# Patient Record
Sex: Female | Born: 2000 | Race: White | Hispanic: No | Marital: Single | Smoking: Never smoker
Health system: Southern US, Community
[De-identification: ages and names within clinical notes are randomized; demographics above are authoritative.]

## PROBLEM LIST (undated history)

## (undated) DIAGNOSIS — R519 Headache, unspecified: Secondary | ICD-10-CM

## (undated) DIAGNOSIS — R51 Headache: Secondary | ICD-10-CM

## (undated) DIAGNOSIS — T7840XA Allergy, unspecified, initial encounter: Secondary | ICD-10-CM

## (undated) HISTORY — DX: Allergy, unspecified, initial encounter: T78.40XA

## (undated) HISTORY — DX: Headache: R51

## (undated) HISTORY — DX: Headache, unspecified: R51.9

---
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
## 2020-01-19 DIAGNOSIS — F411 Generalized anxiety disorder: Secondary | ICD-10-CM | POA: Diagnosis not present

## 2020-01-20 DIAGNOSIS — U071 COVID-19: Secondary | ICD-10-CM | POA: Diagnosis not present

## 2020-01-20 DIAGNOSIS — R0981 Nasal congestion: Secondary | ICD-10-CM | POA: Diagnosis not present

## 2020-01-20 DIAGNOSIS — J029 Acute pharyngitis, unspecified: Secondary | ICD-10-CM | POA: Diagnosis not present

## 2020-01-21 DIAGNOSIS — J069 Acute upper respiratory infection, unspecified: Secondary | ICD-10-CM | POA: Diagnosis not present

## 2020-02-16 DIAGNOSIS — M25571 Pain in right ankle and joints of right foot: Secondary | ICD-10-CM | POA: Diagnosis not present

## 2020-03-07 DIAGNOSIS — J069 Acute upper respiratory infection, unspecified: Secondary | ICD-10-CM | POA: Diagnosis not present

## 2020-05-14 DIAGNOSIS — B349 Viral infection, unspecified: Secondary | ICD-10-CM | POA: Diagnosis not present

## 2020-05-14 DIAGNOSIS — Z20822 Contact with and (suspected) exposure to covid-19: Secondary | ICD-10-CM | POA: Diagnosis not present

## 2020-06-06 DIAGNOSIS — F411 Generalized anxiety disorder: Secondary | ICD-10-CM | POA: Diagnosis not present

## 2020-06-08 DIAGNOSIS — Z20822 Contact with and (suspected) exposure to covid-19: Secondary | ICD-10-CM | POA: Diagnosis not present

## 2020-07-21 DIAGNOSIS — Z0000 Encounter for general adult medical examination without abnormal findings: Secondary | ICD-10-CM | POA: Diagnosis not present

## 2020-07-28 DIAGNOSIS — Z6822 Body mass index (BMI) 22.0-22.9, adult: Secondary | ICD-10-CM | POA: Diagnosis not present

## 2020-07-28 DIAGNOSIS — Z01419 Encounter for gynecological examination (general) (routine) without abnormal findings: Secondary | ICD-10-CM | POA: Diagnosis not present

## 2020-08-01 DIAGNOSIS — J029 Acute pharyngitis, unspecified: Secondary | ICD-10-CM | POA: Diagnosis not present

## 2020-08-05 DIAGNOSIS — J358 Other chronic diseases of tonsils and adenoids: Secondary | ICD-10-CM | POA: Diagnosis not present

## 2020-08-11 DIAGNOSIS — Z20822 Contact with and (suspected) exposure to covid-19: Secondary | ICD-10-CM | POA: Diagnosis not present

## 2020-10-04 DIAGNOSIS — Z23 Encounter for immunization: Secondary | ICD-10-CM | POA: Diagnosis not present

## 2020-10-10 DIAGNOSIS — R509 Fever, unspecified: Secondary | ICD-10-CM | POA: Diagnosis not present

## 2020-10-10 DIAGNOSIS — R059 Cough, unspecified: Secondary | ICD-10-CM | POA: Diagnosis not present

## 2020-10-10 DIAGNOSIS — R0981 Nasal congestion: Secondary | ICD-10-CM | POA: Diagnosis not present

## 2020-10-20 DIAGNOSIS — F3341 Major depressive disorder, recurrent, in partial remission: Secondary | ICD-10-CM | POA: Diagnosis not present

## 2020-11-30 DIAGNOSIS — J020 Streptococcal pharyngitis: Secondary | ICD-10-CM | POA: Diagnosis not present

## 2020-11-30 DIAGNOSIS — R42 Dizziness and giddiness: Secondary | ICD-10-CM | POA: Diagnosis not present

## 2020-11-30 DIAGNOSIS — R0981 Nasal congestion: Secondary | ICD-10-CM | POA: Diagnosis not present

## 2020-12-13 DIAGNOSIS — F411 Generalized anxiety disorder: Secondary | ICD-10-CM | POA: Diagnosis not present

## 2020-12-20 DIAGNOSIS — F411 Generalized anxiety disorder: Secondary | ICD-10-CM | POA: Diagnosis not present

## 2020-12-27 DIAGNOSIS — F411 Generalized anxiety disorder: Secondary | ICD-10-CM | POA: Diagnosis not present

## 2021-01-03 DIAGNOSIS — F411 Generalized anxiety disorder: Secondary | ICD-10-CM | POA: Diagnosis not present

## 2021-01-10 DIAGNOSIS — J039 Acute tonsillitis, unspecified: Secondary | ICD-10-CM | POA: Diagnosis not present

## 2021-01-10 DIAGNOSIS — F411 Generalized anxiety disorder: Secondary | ICD-10-CM | POA: Diagnosis not present

## 2021-01-17 DIAGNOSIS — F411 Generalized anxiety disorder: Secondary | ICD-10-CM | POA: Diagnosis not present

## 2021-02-02 DIAGNOSIS — F3341 Major depressive disorder, recurrent, in partial remission: Secondary | ICD-10-CM | POA: Diagnosis not present

## 2021-02-02 DIAGNOSIS — F902 Attention-deficit hyperactivity disorder, combined type: Secondary | ICD-10-CM | POA: Diagnosis not present

## 2021-02-09 DIAGNOSIS — F411 Generalized anxiety disorder: Secondary | ICD-10-CM | POA: Diagnosis not present

## 2021-03-01 DIAGNOSIS — F411 Generalized anxiety disorder: Secondary | ICD-10-CM | POA: Diagnosis not present

## 2021-03-08 DIAGNOSIS — F411 Generalized anxiety disorder: Secondary | ICD-10-CM | POA: Diagnosis not present

## 2021-03-13 DIAGNOSIS — F411 Generalized anxiety disorder: Secondary | ICD-10-CM | POA: Diagnosis not present

## 2021-03-22 DIAGNOSIS — F411 Generalized anxiety disorder: Secondary | ICD-10-CM | POA: Diagnosis not present

## 2021-03-29 DIAGNOSIS — F411 Generalized anxiety disorder: Secondary | ICD-10-CM | POA: Diagnosis not present

## 2021-04-05 DIAGNOSIS — F411 Generalized anxiety disorder: Secondary | ICD-10-CM | POA: Diagnosis not present

## 2021-04-12 DIAGNOSIS — F411 Generalized anxiety disorder: Secondary | ICD-10-CM | POA: Diagnosis not present

## 2021-04-18 DIAGNOSIS — F411 Generalized anxiety disorder: Secondary | ICD-10-CM | POA: Diagnosis not present

## 2021-04-24 DIAGNOSIS — Z113 Encounter for screening for infections with a predominantly sexual mode of transmission: Secondary | ICD-10-CM | POA: Diagnosis not present

## 2021-04-26 DIAGNOSIS — F411 Generalized anxiety disorder: Secondary | ICD-10-CM | POA: Diagnosis not present

## 2021-05-04 DIAGNOSIS — F411 Generalized anxiety disorder: Secondary | ICD-10-CM | POA: Diagnosis not present

## 2021-05-10 DIAGNOSIS — F411 Generalized anxiety disorder: Secondary | ICD-10-CM | POA: Diagnosis not present

## 2021-05-11 DIAGNOSIS — Z309 Encounter for contraceptive management, unspecified: Secondary | ICD-10-CM | POA: Diagnosis not present

## 2021-05-12 DIAGNOSIS — F411 Generalized anxiety disorder: Secondary | ICD-10-CM | POA: Diagnosis not present

## 2021-05-18 DIAGNOSIS — F411 Generalized anxiety disorder: Secondary | ICD-10-CM | POA: Diagnosis not present

## 2021-05-22 DIAGNOSIS — N946 Dysmenorrhea, unspecified: Secondary | ICD-10-CM | POA: Diagnosis not present

## 2021-05-22 DIAGNOSIS — N926 Irregular menstruation, unspecified: Secondary | ICD-10-CM | POA: Diagnosis not present

## 2021-05-24 DIAGNOSIS — R5383 Other fatigue: Secondary | ICD-10-CM | POA: Diagnosis not present

## 2021-05-24 DIAGNOSIS — R112 Nausea with vomiting, unspecified: Secondary | ICD-10-CM | POA: Diagnosis not present

## 2021-05-24 DIAGNOSIS — Z3046 Encounter for surveillance of implantable subdermal contraceptive: Secondary | ICD-10-CM | POA: Diagnosis not present

## 2021-05-24 DIAGNOSIS — Z30011 Encounter for initial prescription of contraceptive pills: Secondary | ICD-10-CM | POA: Diagnosis not present

## 2021-05-25 ENCOUNTER — Emergency Department (HOSPITAL_COMMUNITY): Payer: BC Managed Care – PPO

## 2021-05-25 ENCOUNTER — Encounter (HOSPITAL_COMMUNITY): Payer: Self-pay

## 2021-05-25 ENCOUNTER — Other Ambulatory Visit: Payer: Self-pay

## 2021-05-25 ENCOUNTER — Inpatient Hospital Stay (HOSPITAL_COMMUNITY)
Admission: EM | Admit: 2021-05-25 | Discharge: 2021-05-31 | DRG: 700 | Disposition: A | Discharge status: Home / Self Care | Payer: BC Managed Care – PPO | Attending: Internal Medicine | Admitting: Internal Medicine

## 2021-05-25 DIAGNOSIS — Y829 Unspecified medical devices associated with adverse incidents: Secondary | ICD-10-CM | POA: Diagnosis present

## 2021-05-25 DIAGNOSIS — F419 Anxiety disorder, unspecified: Secondary | ICD-10-CM | POA: Diagnosis present

## 2021-05-25 DIAGNOSIS — Z880 Allergy status to penicillin: Secondary | ICD-10-CM

## 2021-05-25 DIAGNOSIS — Z20822 Contact with and (suspected) exposure to covid-19: Secondary | ICD-10-CM | POA: Diagnosis present

## 2021-05-25 DIAGNOSIS — F32A Depression, unspecified: Secondary | ICD-10-CM | POA: Diagnosis not present

## 2021-05-25 DIAGNOSIS — K819 Cholecystitis, unspecified: Secondary | ICD-10-CM | POA: Diagnosis present

## 2021-05-25 DIAGNOSIS — R109 Unspecified abdominal pain: Secondary | ICD-10-CM | POA: Diagnosis not present

## 2021-05-25 DIAGNOSIS — R748 Abnormal levels of other serum enzymes: Secondary | ICD-10-CM | POA: Diagnosis not present

## 2021-05-25 DIAGNOSIS — R1011 Right upper quadrant pain: Secondary | ICD-10-CM | POA: Diagnosis not present

## 2021-05-25 DIAGNOSIS — R112 Nausea with vomiting, unspecified: Secondary | ICD-10-CM | POA: Diagnosis not present

## 2021-05-25 DIAGNOSIS — R7401 Elevation of levels of liver transaminase levels: Secondary | ICD-10-CM | POA: Diagnosis not present

## 2021-05-25 DIAGNOSIS — K828 Other specified diseases of gallbladder: Secondary | ICD-10-CM | POA: Diagnosis not present

## 2021-05-25 DIAGNOSIS — Z79899 Other long term (current) drug therapy: Secondary | ICD-10-CM | POA: Diagnosis not present

## 2021-05-25 DIAGNOSIS — R932 Abnormal findings on diagnostic imaging of liver and biliary tract: Secondary | ICD-10-CM | POA: Diagnosis not present

## 2021-05-25 DIAGNOSIS — T8389XA Other specified complication of genitourinary prosthetic devices, implants and grafts, initial encounter: Principal | ICD-10-CM | POA: Diagnosis present

## 2021-05-25 DIAGNOSIS — K7589 Other specified inflammatory liver diseases: Secondary | ICD-10-CM | POA: Diagnosis present

## 2021-05-25 DIAGNOSIS — R945 Abnormal results of liver function studies: Secondary | ICD-10-CM | POA: Diagnosis not present

## 2021-05-25 DIAGNOSIS — R161 Splenomegaly, not elsewhere classified: Secondary | ICD-10-CM | POA: Diagnosis not present

## 2021-05-25 DIAGNOSIS — R160 Hepatomegaly, not elsewhere classified: Secondary | ICD-10-CM | POA: Diagnosis not present

## 2021-05-25 DIAGNOSIS — K810 Acute cholecystitis: Secondary | ICD-10-CM | POA: Diagnosis not present

## 2021-05-25 LAB — CBC WITH DIFFERENTIAL/PLATELET
Abs Immature Granulocytes: 0.1 10*3/uL — ABNORMAL HIGH (ref 0.00–0.07)
Basophils Absolute: 0 10*3/uL (ref 0.0–0.1)
Basophils Relative: 0 %
Eosinophils Absolute: 0.2 10*3/uL (ref 0.0–0.5)
Eosinophils Relative: 2 %
HCT: 39.9 % (ref 36.0–46.0)
Hemoglobin: 12.8 g/dL (ref 12.0–15.0)
Lymphocytes Relative: 60 %
Lymphs Abs: 4.6 10*3/uL — ABNORMAL HIGH (ref 0.7–4.0)
MCH: 29.8 pg (ref 26.0–34.0)
MCHC: 32.1 g/dL (ref 30.0–36.0)
MCV: 93 fL (ref 80.0–100.0)
Monocytes Absolute: 0.2 10*3/uL (ref 0.1–1.0)
Monocytes Relative: 3 %
Myelocytes: 1 %
Neutro Abs: 2.6 10*3/uL (ref 1.7–7.7)
Neutrophils Relative %: 34 %
Platelets: 137 10*3/uL — ABNORMAL LOW (ref 150–400)
RBC: 4.29 MIL/uL (ref 3.87–5.11)
RDW: 13.4 % (ref 11.5–15.5)
WBC: 7.7 10*3/uL (ref 4.0–10.5)
nRBC: 0 % (ref 0.0–0.2)
nRBC: 0 /100 WBC

## 2021-05-25 LAB — URINALYSIS, ROUTINE W REFLEX MICROSCOPIC
Glucose, UA: NEGATIVE mg/dL
Hgb urine dipstick: NEGATIVE
Ketones, ur: NEGATIVE mg/dL
Leukocytes,Ua: NEGATIVE
Nitrite: NEGATIVE
Protein, ur: NEGATIVE mg/dL
Specific Gravity, Urine: 1.015 (ref 1.005–1.030)
pH: 5 (ref 5.0–8.0)

## 2021-05-25 LAB — PROTIME-INR
INR: 1 (ref 0.8–1.2)
Prothrombin Time: 12.8 seconds (ref 11.4–15.2)

## 2021-05-25 LAB — CBC
HCT: 45.5 % (ref 36.0–46.0)
Hemoglobin: 14.7 g/dL (ref 12.0–15.0)
MCH: 29.6 pg (ref 26.0–34.0)
MCHC: 32.3 g/dL (ref 30.0–36.0)
MCV: 91.7 fL (ref 80.0–100.0)
Platelets: 142 10*3/uL — ABNORMAL LOW (ref 150–400)
RBC: 4.96 MIL/uL (ref 3.87–5.11)
RDW: 13.4 % (ref 11.5–15.5)
WBC: 9.1 10*3/uL (ref 4.0–10.5)
nRBC: 0 % (ref 0.0–0.2)

## 2021-05-25 LAB — COMPREHENSIVE METABOLIC PANEL
ALT: 376 U/L — ABNORMAL HIGH (ref 0–44)
AST: 296 U/L — ABNORMAL HIGH (ref 15–41)
Albumin: 3.4 g/dL — ABNORMAL LOW (ref 3.5–5.0)
Alkaline Phosphatase: 500 U/L — ABNORMAL HIGH (ref 38–126)
Anion gap: 10 (ref 5–15)
BUN: 6 mg/dL (ref 6–20)
CO2: 22 mmol/L (ref 22–32)
Calcium: 9.1 mg/dL (ref 8.9–10.3)
Chloride: 106 mmol/L (ref 98–111)
Creatinine, Ser: 0.66 mg/dL (ref 0.44–1.00)
GFR, Estimated: >60 mL/min (ref >60)
Glucose, Bld: 128 mg/dL — ABNORMAL HIGH (ref 70–99)
Potassium: 3.8 mmol/L (ref 3.5–5.1)
Sodium: 138 mmol/L (ref 135–145)
Total Bilirubin: 5.6 mg/dL — ABNORMAL HIGH (ref 0.3–1.2)
Total Protein: 6.8 g/dL (ref 6.5–8.1)

## 2021-05-25 LAB — I-STAT BETA HCG BLOOD, ED (MC, WL, AP ONLY): I-stat hCG, quantitative: <5 m[IU]/mL (ref <5)

## 2021-05-25 LAB — HEPATITIS PANEL, ACUTE
HCV Ab: NONREACTIVE
Hep A IgM: NONREACTIVE
Hep B C IgM: NONREACTIVE
Hepatitis B Surface Ag: NONREACTIVE

## 2021-05-25 LAB — TSH: TSH: 1.948 u[IU]/mL (ref 0.350–4.500)

## 2021-05-25 LAB — BILIRUBIN, DIRECT: Bilirubin, Direct: 2.6 mg/dL — ABNORMAL HIGH (ref 0.0–0.2)

## 2021-05-25 LAB — MAGNESIUM: Magnesium: 1.6 mg/dL — ABNORMAL LOW (ref 1.7–2.4)

## 2021-05-25 LAB — CK: Total CK: 24 U/L — ABNORMAL LOW (ref 38–234)

## 2021-05-25 LAB — C-REACTIVE PROTEIN: CRP: 4.4 mg/dL — ABNORMAL HIGH (ref <1.0)

## 2021-05-25 LAB — SEDIMENTATION RATE: Sed Rate: 1 mm/hr (ref 0–22)

## 2021-05-25 LAB — ACETAMINOPHEN LEVEL: Acetaminophen (Tylenol), Serum: <10 ug/mL — ABNORMAL LOW (ref 10–30)

## 2021-05-25 LAB — AMMONIA: Ammonia: 30 umol/L (ref 9–35)

## 2021-05-25 LAB — PHOSPHORUS: Phosphorus: 3.9 mg/dL (ref 2.5–4.6)

## 2021-05-25 LAB — SARS CORONAVIRUS 2 BY RT PCR: SARS Coronavirus 2 by RT PCR: NEGATIVE

## 2021-05-25 LAB — GAMMA GT: GGT: 338 U/L — ABNORMAL HIGH (ref 7–50)

## 2021-05-25 LAB — LACTATE DEHYDROGENASE: LDH: 315 U/L — ABNORMAL HIGH (ref 98–192)

## 2021-05-25 LAB — LIPASE, BLOOD: Lipase: 31 U/L (ref 11–51)

## 2021-05-25 MED ORDER — FENTANYL CITRATE PF 50 MCG/ML IJ SOSY
25.0000 ug | PREFILLED_SYRINGE | INTRAMUSCULAR | Status: DC | PRN
  Administered 2021-05-25 – 2021-05-26 (×3): 25 ug via INTRAVENOUS
  Filled 2021-05-25 (×3): qty 1

## 2021-05-25 MED ORDER — SODIUM CHLORIDE 0.9 % IV SOLN
75.0000 mL/h | INTRAVENOUS | Status: AC
  Administered 2021-05-25: 75 mL/h via INTRAVENOUS

## 2021-05-25 MED ORDER — ONDANSETRON HCL 4 MG/2ML IJ SOLN
4.0000 mg | Freq: Once | INTRAMUSCULAR | Status: AC
  Administered 2021-05-25: 4 mg via INTRAVENOUS
  Filled 2021-05-25: qty 2

## 2021-05-25 MED ORDER — IOHEXOL 300 MG/ML  SOLN
70.0000 mL | Freq: Once | INTRAMUSCULAR | Status: AC | PRN
  Administered 2021-05-25: 70 mL via INTRAVENOUS

## 2021-05-25 MED ORDER — METRONIDAZOLE 500 MG/100ML IV SOLN
500.0000 mg | Freq: Two times a day (BID) | INTRAVENOUS | Status: DC
  Administered 2021-05-26 – 2021-05-30 (×9): 500 mg via INTRAVENOUS
  Filled 2021-05-25 (×10): qty 100

## 2021-05-25 MED ORDER — SODIUM CHLORIDE 0.9 % IV BOLUS
1000.0000 mL | Freq: Once | INTRAVENOUS | Status: AC
  Administered 2021-05-25: 1000 mL via INTRAVENOUS

## 2021-05-25 MED ORDER — CIPROFLOXACIN IN D5W 400 MG/200ML IV SOLN
400.0000 mg | Freq: Two times a day (BID) | INTRAVENOUS | Status: DC
  Administered 2021-05-26 – 2021-05-30 (×9): 400 mg via INTRAVENOUS
  Filled 2021-05-25 (×11): qty 200

## 2021-05-25 MED ORDER — ONDANSETRON HCL 4 MG PO TABS
4.0000 mg | ORAL_TABLET | Freq: Four times a day (QID) | ORAL | Status: DC | PRN
  Filled 2021-05-25: qty 1

## 2021-05-25 MED ORDER — MORPHINE SULFATE (PF) 4 MG/ML IV SOLN
4.0000 mg | Freq: Once | INTRAVENOUS | Status: AC
  Administered 2021-05-25: 4 mg via INTRAVENOUS
  Filled 2021-05-25: qty 1

## 2021-05-25 MED ORDER — CIPROFLOXACIN IN D5W 400 MG/200ML IV SOLN
400.0000 mg | Freq: Once | INTRAVENOUS | Status: AC
  Administered 2021-05-25: 400 mg via INTRAVENOUS
  Filled 2021-05-25: qty 200

## 2021-05-25 MED ORDER — MAGNESIUM SULFATE IN D5W 1-5 GM/100ML-% IV SOLN
1.0000 g | Freq: Once | INTRAVENOUS | Status: AC
  Administered 2021-05-25: 1 g via INTRAVENOUS
  Filled 2021-05-25: qty 100

## 2021-05-25 MED ORDER — MORPHINE SULFATE (PF) 2 MG/ML IV SOLN
2.0000 mg | Freq: Once | INTRAVENOUS | Status: AC
  Administered 2021-05-25: 2 mg via INTRAVENOUS
  Filled 2021-05-25: qty 1

## 2021-05-25 MED ORDER — METRONIDAZOLE 500 MG/100ML IV SOLN
500.0000 mg | Freq: Once | INTRAVENOUS | Status: AC
  Administered 2021-05-25: 500 mg via INTRAVENOUS
  Filled 2021-05-25: qty 100

## 2021-05-25 MED ORDER — ONDANSETRON HCL 4 MG/2ML IJ SOLN
4.0000 mg | Freq: Four times a day (QID) | INTRAMUSCULAR | Status: DC | PRN
  Administered 2021-05-25 – 2021-05-31 (×13): 4 mg via INTRAVENOUS
  Filled 2021-05-25 (×14): qty 2

## 2021-05-25 NOTE — ED Provider Notes (Signed)
Ultrasound ED Peripheral IV (Provider)  Date/Time: 05/25/2021 7:58 PM Performed by: Pricilla Loveless, MD Authorized by: Pricilla Loveless, MD   Procedure details:    Indications: multiple failed IV attempts     Skin Prep: chlorhexidine gluconate     Location:  Right AC   Angiocath:  20 G   Bedside Ultrasound Guided: Yes     Patient tolerated procedure without complications: Yes     Dressing applied: Yes      Pricilla Loveless, MD 05/25/21 1958

## 2021-05-25 NOTE — Assessment & Plan Note (Signed)
Started on flagyl/cipro will continue Appreciate general surgery consult No evidence of sepsis at this time

## 2021-05-25 NOTE — ED Provider Notes (Signed)
[place] EMERGENCY DEPARTMENT Provider Note   CSN: [id number] Arrival date & time: 05/25/21  1130     History  Chief Complaint  Patient presents with   abnormal labs   Abdominal Pain    Bethany Rose is a 21 y.o. female.  The history is provided by the patient and medical records. No language interpreter was used.  Abdominal Pain  21 year old female presenting with complaint of abnormal liver enzymes.  Patient is an otherwise healthy 21 year old female who recently was transition from her usual birth control pills over to Nexplanon approximately 2 weeks ago.  She reports she has been feeling a bit off since then on the new birth control.  She endorsed some vague abdominal cramping, noticed that her urine is a lot more concentrated than usual, having itchiness to the palms of hands and soles of the feet with associated nausea.  She was seen by her OB/GYN to have the Nexplanon removed yesterday and they noted that her eyes were yellow and therefore labs was obtained.  She was notified today that her liver enzyme is elevated and to come to the ER for further assessment.  She denies any recent sick contact no recent travel or any exotic food.  She denies any new sexual partner no pelvic pain no vaginal bleeding or vaginal discharge.  She does endorse some mild vaginal spotting only.  No history of IV drug use or heavy alcohol use.  Home Medications Prior to Admission medications   Medication Sig Start Date End Date Taking? Authorizing Provider  cetirizine (ZYRTEC) 10 MG tablet Take 10 mg by mouth daily.    [provider]  fluticasone (FLONASE) 50 MCG/ACT nasal spray Place 2 sprays into the nose daily.    [provider]  MELATONIN PO Take by mouth.    [provider]  montelukast (SINGULAIR) 5 MG chewable tablet  10/22/14   [provider]      Allergies    Penicillins    Review of Systems   Review of Systems   Gastrointestinal:  Positive for abdominal pain.  All other systems reviewed and are negative.  Physical Exam Updated Vital Signs BP 128/80 (BP Location: Right Arm)   Pulse (!) 133   Temp 98.2 F (36.8 C) (Oral)   Resp 16   Ht 5' (1.524 m)   Wt 49.9 kg   SpO2 100%   BMI 21.48 kg/m  Physical Exam Vitals and nursing note reviewed.  Constitutional:      General: She is not in acute distress.    Appearance: She is well-developed.  HENT:     Head: Atraumatic.  Eyes:     General: Scleral icterus present.     Conjunctiva/sclera: Conjunctivae normal.  Cardiovascular:     Rate and Rhythm: Tachycardia present.  Pulmonary:     Effort: Pulmonary effort is normal.  Abdominal:     Palpations: Abdomen is soft.     Tenderness: There is abdominal tenderness (Mild diffuse tenderness without local point tenderness guarding or rebound tenderness.).  Musculoskeletal:     Cervical back: Neck supple.  Skin:    Findings: No rash.  Neurological:     Mental Status: She is alert.  Psychiatric:        Mood and Affect: Mood normal.    ED Results / Procedures / Treatments   Labs (all labs ordered are listed, but only abnormal results are displayed) Labs Reviewed  COMPREHENSIVE METABOLIC PANEL - Abnormal; Notable for  the following components:      Result Value   Glucose, Bld 128 (*)    Albumin 3.4 (*)    AST 296 (*)    ALT 376 (*)    Alkaline Phosphatase 500 (*)    Total Bilirubin 5.6 (*)    All other components within normal limits  CBC - Abnormal; Notable for the following components:   Platelets 142 (*)    All other components within normal limits  URINALYSIS, ROUTINE W REFLEX MICROSCOPIC - Abnormal; Notable for the following components:   Color, Urine AMBER (*)    Bilirubin Urine SMALL (*)    All other components within normal limits  LIPASE, BLOOD  I-STAT BETA HCG BLOOD, ED ([facility], [facility], [facility] ONLY)    EKG None  Radiology CT ABDOMEN PELVIS W CONTRAST  Result Date:  05/25/2021 CLINICAL DATA:  Transaminitis, abdominal pain EXAM: CT ABDOMEN AND PELVIS WITH CONTRAST TECHNIQUE: Multidetector CT imaging of the abdomen and pelvis was performed using the standard protocol following bolus administration of intravenous contrast. RADIATION DOSE REDUCTION: This exam was performed according to the departmental dose-optimization program which includes automated exposure control, adjustment of the mA and/or kV according to patient size and/or use of iterative reconstruction technique. CONTRAST:  30m OMNIPAQUE IOHEXOL 300 MG/ML  SOLN COMPARISON:  None Available. FINDINGS: Lower chest: No acute abnormality. Hepatobiliary: Liver is enlarged measuring 18.4 cm in length. No focal hepatic mass identified. Gallbladder is normal size and appears to contain mild increased densities, possibly cholelithiasis or sludge. There is diffuse gallbladder wall thickening. No biliary ductal dilatation. Pancreas: Unremarkable. No pancreatic ductal dilatation or surrounding inflammatory changes. Spleen: Enlarged measuring 13.5 cm in length. Adrenals/Urinary Tract: Adrenal glands are unremarkable. Kidneys are normal, without renal calculi, focal lesion, or hydronephrosis. Bladder is unremarkable. Stomach/Bowel: No bowel obstruction, free air or pneumatosis. No bowel wall edema identified. Moderate amount of retained fecal material in the colon. Appendix is normal. Vascular/Lymphatic: No significant vascular findings are present. No enlarged abdominal or pelvic lymph nodes. Reproductive: Diffuse prominent myometrial vascularity as well as prominent left adnexal vascularity and ovarian vein which measures up to 7 mm in diameter. No suspicious adnexal mass identified. Other: No ascites. Musculoskeletal: No acute or significant osseous findings. IMPRESSION: 1. Hepatosplenomegaly.  No biliary ductal dilatation. 2. Gallbladder wall thickening and possible cholelithiasis or sludge. Consider follow-up right upper  quadrant ultrasound. 3. Prominent myometrial and left adnexal vascularity which is nonspecific and can be seen with pelvic congestion syndrome, correlate clinically. Electronically Signed   By: DOfilia NeasM.D.   On: 05/25/2021 15:15    Procedures Procedures    Medications Ordered in ED Medications  sodium chloride 0.9 % bolus 1,000 mL (0 mLs Intravenous Stopped 05/25/21 1510)  ondansetron (ZOFRAN) injection 4 mg (4 mg Intravenous Given 05/25/21 1431)  iohexol (OMNIPAQUE) 300 MG/ML solution 70 mL (70 mLs Intravenous Contrast Given 05/25/21 1502)    ED Course/ Medical Decision Making/ A&P                           Medical Decision Making Amount and/or Complexity of Data Reviewed Labs: ordered.   BP 128/80 (BP Location: Right Arm)   Pulse (!) 133   Temp 98.2 F (36.8 C) (Oral)   Resp 16   Ht 5' (1.524 m)   Wt 49.9 kg   SpO2 100%   BMI 21.48 kg/m   1:55 PM This is a healthy 21year old female presenting with elevations  of her liver enzyme.  Patient recently had a Nexplanon implanted to her left upper extremity approximately 2 weeks ago but subsequently developed abdominal cramping, nausea, itchiness in her palms of hands and soles of feet, and darker urine.  Was evaluated by OB/GYN yesterday and was noted to have scleral icterus and jaundice.  Labs was obtained and patient was notified today that her liver enzymes elevated.  At this time patient is overall well-appearing, she does appears jaundiced with scleral icterus.  She has some mild diffuse abdominal tenderness without focal point tenderness and no obvious signs of hepatomegaly.  I suspect her transaminitis may be secondary to recent Nexplanon. However likelihood of obstructive gallstone is high on my differential. I have considered HELLP but the patient is currently not pregnant, I have also considered Rochele Raring but she denies any new sexual partner and denies having any pelvic pain or vaginal discharge therefore have  low suspicion for PID leading to hepatitis.  I will obtain a hepatitis panel, will give IV fluid as patient is tachycardic, Zofran as needed for nausea and will obtain abdominal pelvis CT scan for further assessment.  Care discussed with Dr. Maryan Rued.  3:25 PM Labs and imaging obtained and independently reviewed interpreted by me and I agree with radiologist interpretation.  Presley test is negative, normal lipase, evidence of transaminitis with AST 296, ALT 376, alk phos 500, and total bili of 5.6.  Normal WBC.  Urinalysis without signs of urinary tract infection.  CT scan of the abdomen pelvis demonstrate hepatosplenomegaly without any biliary ductal dilatation.  Gallbladder wall thickening and possible cholelithiasis or sludge.  Radiologist recommend right upper quadrant ultrasound follow-up.  Prominent myometrial and left adnexal vascularity which is nonspecific and may be seen in pelvic congestion syndrome.  I have ordered limited abdominal ultrasound for further assessment of patient's condition.  Patient signed out to oncoming provider who will follow up on ultrasound result and if unremarkable, patient should be consulted to GI for further assessments of her transaminitis.  If she has evidence of cholecystitis, she will need to be consulted with general surgery.  This patient presents to the ED for concern of transaminitis, this involves an extensive number of treatment options, and is a complaint that carries with it a high risk of complications and morbidity.  The differential diagnosis includes obstructive gallstone, hepatitis, HELLP, Fitz-Hugh Curtis syndrome, side effect of medication, cirrhosis  Co morbidities that complicate the patient evaluation recently on Nexplanon Additional history obtained:  Additional history obtained from family member External records from outside source obtained and reviewed including notes from [facility]  Lab Tests:  I Ordered, and personally interpreted labs.   The pertinent results include:  as above  Imaging Studies ordered:  I ordered imaging studies including abd/pelvis CT I independently visualized and interpreted imaging which showed signs of gallstones I agree with the radiologist interpretation  Cardiac Monitoring:  The patient was maintained on a cardiac monitor.  I personally viewed and interpreted the cardiac monitored which showed an underlying rhythm of: sinus tachycardia  Medicines ordered and prescription drug management:  I ordered medication including IVF/morphine/zofran  for sxs contgrol Reevaluation of the patient after these medicines showed that the patient improved I have reviewed the patients home medicines and have made adjustments as needed  Test Considered: as above  Critical Interventions: IVF  antiemetic   Problem List / ED Course: transaminitis  Reevaluation:  After the interventions noted above, I reevaluated the patient and found that they have :  improved  Social Determinants of Health:   Dispostion:  After consideration of the diagnostic results and the patients response to treatment, I feel that the patent would benefit from admission.         Final Clinical Impression(s) / ED Diagnoses Final diagnoses:  None    Rx / DC Orders ED Discharge Orders     None         Domenic Moras, PA-C 05/25/21 1534    Blanchie Dessert, MD 05/31/21 1726

## 2021-05-25 NOTE — Progress Notes (Addendum)
Pt arrived on floor, skin assessed, safety ensured.

## 2021-05-25 NOTE — ED Provider Notes (Signed)
Care of patient assumed from [place] at 1500.  Agree with history, physical exam and plan.  See their note for further details. Briefly, 21 year old female presents to the emergency department with a chief complaint of abnormal liver enzymes.  2 weeks prior patient had Nexplanon placed.  She had some abdominal cramping, pruritus to palms and soles of her feet, and dark urine which prompted OB/GYN to have Nexplanon removed.  Patient was noted to have scleral icterus by OB/GYN provider and liver enzymes were evaluated and found to be elevated.   Physical Exam  BP 128/80 (BP Location: Right Arm)   Pulse (!) 133   Temp 98.2 F (36.8 C) (Oral)   Resp 16   Ht 5' (1.524 m)   Wt 49.9 kg   SpO2 100%   BMI 21.48 kg/m   Physical Exam Vitals and nursing note reviewed.  Constitutional:      General: She is not in acute distress.    Appearance: She is not ill-appearing, toxic-appearing or diaphoretic.  HENT:     Head: Normocephalic.  Eyes:     General: No scleral icterus.       Right eye: No discharge.        Left eye: No discharge.  Cardiovascular:     Rate and Rhythm: Normal rate.  Pulmonary:     Effort: Pulmonary effort is normal.  Abdominal:     General: Abdomen is flat. Bowel sounds are normal. There is no distension. There are no signs of injury.     Palpations: Abdomen is soft. There is no mass or pulsatile mass.     Tenderness: There is abdominal tenderness in the right lower quadrant, suprapubic area, left upper quadrant and left lower quadrant. There is no guarding or rebound. Positive signs include Murphy's sign.     Hernia: There is no hernia in the umbilical area or ventral area.  Skin:    General: Skin is warm and dry.  Neurological:     General: No focal deficit present.     Mental Status: She is alert.  Psychiatric:        Behavior: Behavior is cooperative.    Procedures  Procedures  ED Course / MDM   Clinical Course as of 05/27/21 0535  Thu May 25, 2021  1727 I  spoke to general surgeon Dr. Janee Morn who has suspicion for choledocholithiasis.  Recommended reaching out to gastroenterology and hospitalist team for admission.  General surgery will consult. [PB]  1754 I spoke with gastroenterologist Dr. Marina Goodell who reviewed patient's labs and imaging, he has a low suspicion for choledocholithiasis at this time and believes that symptoms are due to hepatitis.  Advised that [facility] gastroenterology will see the patient for consult tomorrow. [PB]  1827 I spoke with Dr. Adela Glimpse who will see the patient for admission. [PB]    Clinical Course User Index [PB] Haskel Schroeder, PA-C   Medical Decision Making Amount and/or Complexity of Data Reviewed Labs: ordered. Radiology: ordered.  Risk Prescription drug management. Decision regarding hospitalization.   On my assessment patient is complaining of lower abdominal pain.  We will give morphine for pain management.  CT imaging obtained by previous provider showed possible cholelithiasis or sludge to gallbladder.  Will obtain right upper quadrant ultrasound to look for biliary cause of patient's transaminitis.  Right upper quadrant ultrasound shows no gallstones, gallbladder wall thickening, pericholecystic fluid, and positive sonographic Murphy sign.  Findings concerning for acute acalculous cholecystitis.  Due to these findings we  will start patient on Cipro and Flagyl.  Patient reports that she has anaphylactic reaction to penicillin.  We will consult general surgery.  I spoke with on-call general surgeon, see above note for further details on this conversation.  I spoke with on-call gastroenterologist with [facility], see above note for further details on his conversation  I spoke with Dr. Adela Glimpse who will see the patient for admission.      Haskel Schroeder, PA-C 05/27/21 0535    Pricilla Loveless, MD 05/30/21 [phone]

## 2021-05-25 NOTE — ED Notes (Signed)
Patient ambulates to the restroom at this time 

## 2021-05-25 NOTE — Progress Notes (Signed)
Pharmacy Antibiotic Note  Bethany Rose is a 21 y.o. female admitted on 05/25/2021 with  intra-abdominal infection .  Pharmacy has been consulted for Ciprofloxacin dosing.  Plan: Ciprofloxacin 400 mg IV q12h Metronidazole 500 mg IV q12h Follow-up clinical status, renal function Follow-up cultures, LOT, de-escalate as able   Height: 5' (152.4 cm) Weight: 49.9 kg (110 lb) IBW/kg (Calculated) : 45.5  Temp (24hrs), Avg:98.2 F (36.8 C), Min:98.1 F (36.7 C), Max:98.2 F (36.8 C)  Recent Labs  Lab 05/25/21 1150  WBC 9.1  CREATININE 0.66    Estimated Creatinine Clearance: 80.6 mL/min (by C-G formula based on SCr of 0.66 mg/dL).    Allergies  Allergen Reactions   Penicillins     Antimicrobials this admission: Ciprofloxacin 5/25 >>  Metronidazole 5/25 >>   Microbiology results: 5/25 BCx: pending   Thank you for allowing pharmacy to be a part of this patient's care.  Vance Peper, PharmD PGY1 Pharmacy Resident 05/25/2021 7:06 PM   Please check AMION for all [place] phone numbers After 10:00 PM, call [place] [phone]

## 2021-05-25 NOTE — Subjective & Objective (Signed)
Patient was coming from home with abnormal labs.  She was seen by OB/GYN and was told that her liver enzymes were elevated and told to come to emergency department.  Here she has been having abdominal pain since about a week ago that comes and goes. She is otherwise healthy the only thing that she did have is Nexplanon placed 2 weeks ago. Also noticed that her urine has been more concentrated she noticed it with itchiness on the palms of her hands and soles as well as some nausea Nexplanon was taken out yesterday By OB/GYN and then they noticed that her eyes were yellow and obtain labs and she was told that her liver enzymes were elevated Denies any history of IV drug use.  Alcohol abuse

## 2021-05-25 NOTE — ED Notes (Signed)
Provider at bedside at this time to attempt USGPIV. Patient reports understanding of the plan of care

## 2021-05-25 NOTE — ED Notes (Signed)
This RN contacted the provider regarding the patient's pain level and request for pain meds at this time via secure chat

## 2021-05-25 NOTE — Assessment & Plan Note (Addendum)
Differential includes hepatitis vs biliary disease, Denies acetaminophen ingestion Level ordered Appreciate general surgery consult defer to them if further imaging such as HIDA scan could be helpful Order viral panel and inflammatory markers Pharmacy has reviewed cases of Liver disease associated with nexplaon   Per the FDA database there are 9 reports of jardiance and 6 of liver dysfunction. Unfortunately the database does not give more info about the specific cases. So it is possible.  As per Pharmacy
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
## 2021-05-25 NOTE — H&P (Signed)
Bethany Rose ZOX:[id number] DOB: 11/13/2000 DOA: 05/25/2021     PCP: Trey SailorsPa, [facility]   Outpatient Specialists:  NONE    Patient arrived to ER on 05/25/21 at 1130 Referred by Attending Therisa Doyneoutova, Eathan Groman, MD   Patient coming from:    home Lives  With family    Chief Complaint:   Chief Complaint  Patient presents with   abnormal labs   Abdominal Pain    HPI: Bethany Rose is a 21 y.o. female with medical history significant of allergic rhinitis    Presented with  elevated LFT's Patient was coming from home with abnormal labs.  She was seen by OB/GYN and was told that her liver enzymes were elevated and told to come to emergency department.  Here she has been having abdominal pain since about a week ago that comes and goes. She is otherwise healthy the only thing that she did have is Nexplanon placed 2 weeks ago. Also noticed that her urine has been more concentrated she noticed it with itchiness on the palms of her hands and soles as well as some nausea Nexplanon was taken out yesterday By OB/GYN and then they noticed that her eyes were yellow and obtain labs and she was told that her liver enzymes were elevated Denies any history of IV drug use.  Alcohol abuse    [place] in [place] Reports only drinks occasionally does not smoke  No ingestion of acetaminophen, mushrooms or unusual foods No fever or chills, does report hot flushes No recent illness    Initial COVID TEST  NEGATIVE   Lab Results  Component Value Date   SARSCOV2NAA NEGATIVE 05/25/2021   SARSCOV2NAA Not Detected 12/22/2019   SARSCOV2NAA Not Detected 10/16/2018       While in ER: Clinical Course as of 05/25/21 1844  Thu May 25, 2021  1727 I spoke to general surgeon Dr. Janee Mornhompson who has suspicion for choledocholithiasis.  Recommended reaching out to gastroenterology and hospitalist team for admission.  General surgery will consult. [PB]  1754 I spoke with  gastroenterologist Dr. Marina GoodellPerry who reviewed patient's labs and imaging, he has a low suspicion for choledocholithiasis at this time and believes that symptoms are due to hepatitis.  Advised that [facility] will see the patient for consult tomorrow. [PB]  1827 I spoke with Dr. Adela Glimpseoutova who will see the patient for admission. [PB]    Clinical Course User Index [PB] Haskel SchroederBadalamente, Peter R, PA-C       CTabd/pelvis -showing hepatosplenomegaly and no biliary dilatation but there is a gallbladder wall thickening with possible cholelithiasis with sludge Gallbladder wall thickening with "pericholecystic fluid positive sonographic Eulah PontMurphy sign concerning for acalculus cholecystitis    Following Medications were ordered in ER: Medications  ciprofloxacin (CIPRO) IVPB 400 mg (has no administration in time range)    And  metroNIDAZOLE (FLAGYL) IVPB 500 mg (has no administration in time range)  sodium chloride 0.9 % bolus 1,000 mL (0 mLs Intravenous Stopped 05/25/21 1510)  ondansetron (ZOFRAN) injection 4 mg (4 mg Intravenous Given 05/25/21 1431)  iohexol (OMNIPAQUE) 300 MG/ML solution 70 mL (70 mLs Intravenous Contrast Given 05/25/21 1502)  morphine (PF) 2 MG/ML injection 2 mg (2 mg Intravenous Given 05/25/21 1624)  morphine (PF) 4 MG/ML injection 4 mg (4 mg Intravenous Given 05/25/21 1818)  sodium chloride 0.9 % bolus 1,000 mL (1,000 mLs Intravenous New Bag/Given 05/25/21 1823)    _______________________________________________________ ER Provider Called:    [facility] Dr.  Marina Goodell They Recommend admit to medicine   Will see in AM work-up for hepatitis which of note was negative  ER Provider Called: general surgery Dr. Charlesetta Shanks They Recommend admit to medicine   And GI consult  ED Triage Vitals  Enc Vitals Group     BP 05/25/21 1134 128/80     Pulse Rate 05/25/21 1134 (!) 133     Resp 05/25/21 1134 16     Temp 05/25/21 1134 98.2 F (36.8 C)     Temp Source 05/25/21 1134 Oral     SpO2 05/25/21  1134 100 %     Weight 05/25/21 1140 110 lb (49.9 kg)     Height 05/25/21 1140 5' (1.524 m)     Head Circumference --      Peak Flow --      Pain Score 05/25/21 1140 5     Pain Loc --      Pain Edu? --      Excl. in GC? --   TMAX(24)@     _________________________________________ Significant initial  Findings: Abnormal Labs Reviewed  COMPREHENSIVE METABOLIC PANEL - Abnormal; Notable for the following components:      Result Value   Glucose, Bld 128 (*)    Albumin 3.4 (*)    AST 296 (*)    ALT 376 (*)    Alkaline Phosphatase 500 (*)    Total Bilirubin 5.6 (*)    All other components within normal limits  CBC - Abnormal; Notable for the following components:   Platelets 142 (*)    All other components within normal limits  URINALYSIS, ROUTINE W REFLEX MICROSCOPIC - Abnormal; Notable for the following components:   Color, Urine AMBER (*)    Bilirubin Urine SMALL (*)    All other components within normal limits  ACETAMINOPHEN LEVEL - Abnormal; Notable for the following components:   Acetaminophen (Tylenol), Serum <10 (*)    All other components within normal limits  CK - Abnormal; Notable for the following components:   Total CK 24 (*)    All other components within normal limits  GAMMA GT - Abnormal; Notable for the following components:   GGT 338 (*)    All other components within normal limits  MAGNESIUM - Abnormal; Notable for the following components:   Magnesium 1.6 (*)    All other components within normal limits  CBC WITH DIFFERENTIAL/PLATELET - Abnormal; Notable for the following components:   Platelets 137 (*)    Lymphs Abs 4.6 (*)    Abs Immature Granulocytes 0.10 (*)    All other components within normal limits  LACTATE DEHYDROGENASE - Abnormal; Notable for the following components:   LDH 315 (*)    All other components within normal limits  BILIRUBIN, DIRECT - Abnormal; Notable for the following components:   Bilirubin, Direct 2.6 (*)    All other components  within normal limits      ECG: Ordered Personally reviewed by me showing: HR : 85 Rhythm: RSR' in V1 or V2, probably normal variant Borderline T abnormalities, anterior leads QTC 437    The recent clinical data is shown below. Vitals:   05/25/21 1623 05/25/21 1630 05/25/21 1645 05/25/21 1700  BP: 133/88 (!) 134/91 128/81 (!) 140/91  Pulse: 84 92 83 81  Resp: 18     Temp: 98.1 F (36.7 C)     TempSrc: Oral     SpO2: 99% 99% 99% 97%  Weight:      Height:  WBC     Component Value Date/Time   WBC 9.1 05/25/2021 1150      UA   no evidence of UTI     Urine analysis:    Component Value Date/Time   COLORURINE AMBER (A) 05/25/2021 1143   APPEARANCEUR CLEAR 05/25/2021 1143   LABSPEC 1.015 05/25/2021 1143   PHURINE 5.0 05/25/2021 1143   GLUCOSEU NEGATIVE 05/25/2021 1143   HGBUR NEGATIVE 05/25/2021 1143   BILIRUBINUR SMALL (A) 05/25/2021 1143   KETONESUR NEGATIVE 05/25/2021 1143   PROTEINUR NEGATIVE 05/25/2021 1143   NITRITE NEGATIVE 05/25/2021 1143   LEUKOCYTESUR NEGATIVE 05/25/2021 1143    Results for orders placed or performed during the hospital encounter of 05/25/21  SARS Coronavirus 2 by RT PCR (hospital order, performed in [facility] hospital lab) *cepheid single result test* Anterior Nasal Swab     Status: None   Collection Time: 05/25/21  6:46 PM   Specimen: Anterior Nasal Swab  Result Value Ref Range Status   SARS Coronavirus 2 by RT PCR NEGATIVE NEGATIVE Final         __________________________________________ Hospitalist was called for admission for   Cholecystitis     The following Work up has been ordered so far:  Orders Placed This Encounter  Procedures   Blood culture (routine x 2)   CT ABDOMEN PELVIS W CONTRAST   US Abdomen Limited   Lipase, blood   Comprehensive metabolic panel   CBC   Urinalysis, Routine w reflex microscopic   Hepatitis panel, acute   Acetaminophen level   CK   Protime-INR   Ammonia   Gamma GT   Magnesium    Phosphorus   TSH   CBC with Differential/Platelet   Lactate dehydrogenase   Bilirubin, direct   Diet NPO time specified   Re-check Vital Signs   Consult to general surgery   Consult to gastroenterology   Consult to hospitalist   I-Stat beta hCG blood, ED   EKG 12-Lead   Place in observation (patient's expected length of stay will be less than 2 midnights)     OTHER Significant initial  Findings:  labs showing:  Recent Labs  Lab 05/25/21 1150 05/25/21 2002  NA 138  --   K 3.8  --   CO2 22  --   GLUCOSE 128*  --   BUN 6  --   CREATININE 0.66  --   CALCIUM 9.1  --   MG  --  1.6*  PHOS  --  3.9    Cr   stable,   Lab Results  Component Value Date   CREATININE 0.66 05/25/2021    Recent Labs  Lab 05/25/21 1150  AST 296*  ALT 376*  ALKPHOS 500*  BILITOT 5.6*  PROT 6.8  ALBUMIN 3.4*   Lab Results  Component Value Date   CALCIUM 9.1 05/25/2021   PHOS 3.9 05/25/2021    Plt: Lab Results  Component Value Date   PLT 137 (L) 05/25/2021     COVID-19 Labs  Recent Labs    05/25/21 2002  LDH 315*         Recent Labs  Lab 05/25/21 1150  WBC 9.1  HGB 14.7  HCT 45.5  MCV 91.7  PLT 142*    HG/HCT  stable,      Component Value Date/Time   HGB 14.7 05/25/2021 1150   HCT 45.5 05/25/2021 1150   MCV 91.7 05/25/2021 1150      Recent Labs  Lab 05/25/21 1150  LIPASE 31   Recent Labs  Lab 05/25/21 2002  AMMONIA 30      Cardiac Panel (last 3 results) Recent Labs    05/25/21 2002  CKTOTAL 24*      Cultures: No results found for: SDES, [place], CULT, REPTSTATUS   Radiological Exams on Admission: CT ABDOMEN PELVIS W CONTRAST  Result Date: 05/25/2021 CLINICAL DATA:  Transaminitis, abdominal pain EXAM: CT ABDOMEN AND PELVIS WITH CONTRAST TECHNIQUE: Multidetector CT imaging of the abdomen and pelvis was performed using the standard protocol following bolus administration of intravenous contrast. RADIATION DOSE REDUCTION: This exam was  performed according to the departmental dose-optimization program which includes automated exposure control, adjustment of the mA and/or kV according to patient size and/or use of iterative reconstruction technique. CONTRAST:  43mL OMNIPAQUE IOHEXOL 300 MG/ML  SOLN COMPARISON:  None Available. FINDINGS: Lower chest: No acute abnormality. Hepatobiliary: Liver is enlarged measuring 18.4 cm in length. No focal hepatic mass identified. Gallbladder is normal size and appears to contain mild increased densities, possibly cholelithiasis or sludge. There is diffuse gallbladder wall thickening. No biliary ductal dilatation. Pancreas: Unremarkable. No pancreatic ductal dilatation or surrounding inflammatory changes. Spleen: Enlarged measuring 13.5 cm in length. Adrenals/Urinary Tract: Adrenal glands are unremarkable. Kidneys are normal, without renal calculi, focal lesion, or hydronephrosis. Bladder is unremarkable. Stomach/Bowel: No bowel obstruction, free air or pneumatosis. No bowel wall edema identified. Moderate amount of retained fecal material in the colon. Appendix is normal. Vascular/Lymphatic: No significant vascular findings are present. No enlarged abdominal or pelvic lymph nodes. Reproductive: Diffuse prominent myometrial vascularity as well as prominent left adnexal vascularity and ovarian vein which measures up to 7 mm in diameter. No suspicious adnexal mass identified. Other: No ascites. Musculoskeletal: No acute or significant osseous findings. IMPRESSION: 1. Hepatosplenomegaly.  No biliary ductal dilatation. 2. Gallbladder wall thickening and possible cholelithiasis or sludge. Consider follow-up right upper quadrant ultrasound. 3. Prominent myometrial and left adnexal vascularity which is nonspecific and can be seen with pelvic congestion syndrome, correlate clinically. Electronically Signed   By: Ofilia Neas M.D.   On: 05/25/2021 15:15   US Abdomen Limited  Result Date: 05/25/2021 CLINICAL DATA:   Transaminitis. EXAM: ULTRASOUND ABDOMEN LIMITED RIGHT UPPER QUADRANT COMPARISON:  CT abdomen and pelvis 05/25/2021. FINDINGS: Gallbladder: No gallstones are identified. There is gallbladder wall thickening measuring up to 4 mm with mild pericholecystic fluid. Sonographic Percell Miller sign is positive per sonographer. Common bile duct: Diameter: 1.9 mm. Liver: No focal lesion identified. Within normal limits in parenchymal echogenicity. Portal vein is patent on color Doppler imaging with normal direction of blood flow towards the liver. Other: None. IMPRESSION: 1. No gallstones. Gallbladder wall thickening, pericholecystic fluid and positive sonographic Murphy sign. Findings are concerning for acute acalculous cholecystitis. Electronically Signed   By: Ronney Asters M.D.   On: 05/25/2021 17:10   _______________________________________________________________________________________________________ Latest   Blood pressure (!) 140/91, pulse 81, temperature 98.1 F (36.7 C), temperature source Oral, resp. rate 18, height 5' (1.524 m), weight 49.9 kg, SpO2 97 %.   Vitals  labs and radiology finding personally reviewed  Review of Systems:    Pertinent positives include:   abdominal pain, nausea,  jaundice  Constitutional:  No weight loss, night sweats, Fevers, chills, fatigue, weight loss  HEENT:  No headaches, Difficulty swallowing,Tooth/dental problems,Sore throat,  No sneezing, itching, ear ache, nasal congestion, post nasal drip,  Cardio-vascular:  No chest pain, Orthopnea, PND, anasarca, dizziness, palpitations.no Bilateral lower extremity swelling  GI:  No heartburn, indigestion,vomiting, diarrhea,  change in bowel habits, loss of appetite, melena, blood in stool, hematemesis Resp:  no shortness of breath at rest. No dyspnea on exertion, No excess mucus, no productive cough, No non-productive cough, No coughing up of blood.No change in color of mucus.No wheezing. Skin:  no rash or lesions.  GU:  no  dysuria, change in color of urine, no urgency or frequency. No straining to urinate.  No flank pain.  Musculoskeletal:  No joint pain or no joint swelling. No decreased range of motion. No back pain.  Psych:  No change in mood or affect. No depression or anxiety. No memory loss.  Neuro: no localizing neurological complaints, no tingling, no weakness, no double vision, no gait abnormality, no slurred speech, no confusion  All systems reviewed and apart from [place] all are negative _______________________________________________________________________________________________ Past Medical History:   Past Medical History:  Diagnosis Date   Allergy    Headache      History reviewed. No pertinent surgical history.  Social History:  Ambulatory   independently       reports that she has never smoked. She has never used smokeless tobacco. She reports that she does not drink alcohol and does not use drugs.    Family History:  Family History  Problem Relation Age of Onset   ADD / ADHD Brother    ______________________________________________________________________________________________ Allergies: Allergies  Allergen Reactions   Penicillins      Prior to Admission medications   Medication Sig Start Date End Date Taking? Authorizing Provider  cetirizine (ZYRTEC) 10 MG tablet Take 10 mg by mouth daily.    [provider]  fluticasone (FLONASE) 50 MCG/ACT nasal spray Place 2 sprays into the nose daily.    [provider]  MELATONIN PO Take by mouth.    [provider]  montelukast (SINGULAIR) 5 MG chewable tablet  10/22/14   [provider]    ___________________________________________________________________________________________________ Physical Exam:    05/25/2021    5:00 PM 05/25/2021    4:45 PM 05/25/2021    4:30 PM  Vitals with BMI  Systolic [id number]  Diastolic 91 81 91  Pulse 81 83 92     1. General:  in No  Acute distress    Chronically ill   -appearing 2. Psychological: Alert and   Oriented 3. Head/ENT:   Dry Mucous Membranes                          Head Non traumatic, neck supple                          Normal   Dentition joundice 4. SKIN: normal  Skin turgor,  Skin clean Dry and intact no rash 5. Heart: Regular rate and rhythm no  Murmur, no Rub or gallop 6. Lungs:  Clear to auscultation bilaterally, no wheezes or crackles   7. Abdomen: Soft, RUQ tender  Non distended bowel sounds present 8. Lower extremities: no clubbing, cyanosis, no  edema 9. Neurologically Grossly intact, moving all 4 extremities equally   10. MSK: Normal range of motion    Chart has been reviewed  ______________________________________________________________________________________________  Assessment/Plan 21 y.o. female with medical history significant of allergic rhinitis    Admitted for   Cholecystitis, transaminates      Present on Admission:  Transaminitis  Acalculous cholecystitis  Hypomagnesemia     Transaminitis Differential includes hepatitis vs biliary disease, Denies acetaminophen ingestion  Level ordered Appreciate general surgery consult defer to them if further imaging such as HIDA scan could be helpful Order viral panel and inflammatory markers Pharmacy has reviewed cases of Liver disease associated with nexplaon   Per the FDA database there are 9 reports of jardiance and 6 of liver dysfunction. Unfortunately the database does not give more info about the specific cases. So it is possible.  As per Pharmacy   Acalculous cholecystitis Started on flagyl/cipro will continue Appreciate general surgery consult No evidence of sepsis at this time   Hypomagnesemia Will repalce    Other plan as per orders.  DVT prophylaxis:  SCD     Code Status:    Code Status: Not on file FULL CODE   as per patient   I had personally discussed CODE STATUS with patient and family   Family Communication:    Family  at  Bedside  plan of care was discussed   with mother  Disposition Plan:        To home once workup is complete and patient is stable   Following barriers for discharge:                            Electrolytes corrected                                                             Pain controlled with PO medications                                                            Will need to be able to tolerate PO                                                     Will need consultants to evaluate patient prior to discharge                      Consults called: [facility]. Dr.  Henrene Pastor is aware, general Surgery Dr. Grandville Silos  Admission status:  ED Disposition     ED Disposition  [place]: [place] [[id number]]  Level of Care: Med-Surg [16]  May place patient in observation at [facility] or [facility] if equivalent level of care is available:: No  Covid Evaluation: Asymptomatic - no recent exposure (last 10 days) testing not required  Diagnosis: Transaminitis [id number]  Admitting Physician: Toy Baker [[id number]]  Attending Physician: Toy Baker [[id number]]           Obs       Level of care     medical floor       Lab Results  Component Value Date   [place] NEGATIVE 05/25/2021      Precautions: admitted as   Covid Negative       Jamicia Haaland 05/25/2021, 9:33 PM  [facility]     after 2 AM please page floor coverage PA If 7AM-7PM, please contact the day team taking care of the patient using [email or web address]   Patient was evaluated in the context of the global COVID-19 pandemic, which necessitated consideration that the patient might be at risk for infection with the SARS-CoV-2 virus that causes COVID-19. Institutional protocols and algorithms that pertain to the evaluation of patients at risk for COVID-19 are in a state of rapid change based on information released by regulatory bodies  including the CDC and federal and state organizations. These policies and algorithms were followed during the patient's care.

## 2021-05-25 NOTE — ED Triage Notes (Signed)
Pt arrived POV from home c/o abnormal labs. Pt states she had labs done at her OBGYN and they told her that her liver enzymes were extremely elevated and to come here. Pt endorses generalized abdominal pain since last Friday that comes and goes.

## 2021-05-25 NOTE — Assessment & Plan Note (Signed)
Will repalce

## 2021-05-25 NOTE — ED Notes (Signed)
Phlebotomist at bedside at this time  

## 2021-05-25 NOTE — ED Notes (Signed)
Hosptialist at bedside at this time

## 2021-05-25 NOTE — Consult Note (Signed)
Reason for Consult:elevated LFTs, acalculous cholecystitis Referring Physician: Otho Rose is an 21 y.o. female.  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 For the past week she has been having intermittent abdominal pain and nausea.  The pain is mostly in her epigastrium and right upper quadrant.  She then developed itching of her hands and feet.  Her urine has been dark.  She saw her OB/GYN who thought this may be a reaction to her birth control implant.  That was removed.  Her OB/GYN noted that she had scleral icterus and so she sent some laboratory studies.  This revealed elevated liver function tests and she was directed to the emergency department.  Work-up here confirms elevated liver function tests as well as signs of a calculus cholecystitis on ultrasound.  I was asked to see her for surgical management.  Past Medical History:  Diagnosis Date   Allergy    Headache     History reviewed. No pertinent surgical history.  Family History  Problem Relation Age of Onset   ADD / ADHD Brother     Social History:  reports that she has never smoked. She has never used smokeless tobacco. She reports that she does not drink alcohol and does not use drugs.  Allergies:  Allergies  Allergen Reactions   Penicillins     Medications: I have reviewed the patient's current medications.  Results for orders placed or performed during the hospital encounter of 05/25/21 (from the past 48 hour(s))  Urinalysis, Routine w reflex microscopic Urine, Clean Catch     Status: Abnormal   Collection Time: 05/25/21 11:43 AM  Result Value Ref Range   Color, Urine AMBER (A) YELLOW    Comment: BIOCHEMICALS MAY BE AFFECTED BY COLOR   APPearance CLEAR CLEAR   Specific Gravity, Urine 1.015 1.005 - 1.030   pH 5.0 5.0 - 8.0   Glucose, UA NEGATIVE NEGATIVE mg/dL   Hgb urine dipstick NEGATIVE NEGATIVE   Bilirubin Urine SMALL (A) NEGATIVE   Ketones, ur NEGATIVE NEGATIVE mg/dL    Protein, ur NEGATIVE NEGATIVE mg/dL   Nitrite NEGATIVE NEGATIVE   Leukocytes,Ua NEGATIVE NEGATIVE    Comment: Performed at [facility] [place] [facility], [address] [place], [place] [address]  Lipase, blood     Status: None   Collection Time: 05/25/21 11:50 AM  Result Value Ref Range   Lipase 31 11 - 51 U/L    Comment: Performed at [facility], [address] [place], [place] [address]  Comprehensive metabolic panel     Status: Abnormal   Collection Time: 05/25/21 11:50 AM  Result Value Ref Range   Sodium 138 135 - 145 mmol/L   Potassium 3.8 3.5 - 5.1 mmol/L   Chloride 106 98 - 111 mmol/L   CO2 22 22 - 32 mmol/L   Glucose, Bld 128 (H) 70 - 99 mg/dL    Comment: Glucose reference range applies only to samples taken after fasting for at least 8 hours.   BUN 6 6 - 20 mg/dL   Creatinine, Ser 6.57 0.44 - 1.00 mg/dL   Calcium 9.1 8.9 - 84.6 mg/dL   Total Protein 6.8 6.5 - 8.1 g/dL   Albumin 3.4 (L) 3.5 - 5.0 g/dL   AST 962 (H) 15 - 41 U/L   ALT 376 (H) 0 - 44 U/L   Alkaline Phosphatase 500 (H) 38 - 126 U/L   Total Bilirubin 5.6 (H) 0.3 - 1.2 mg/dL  GFR, Estimated >60 >60 mL/min    Comment: (NOTE) Calculated using the CKD-EPI Creatinine Equation (2021)    Anion gap 10 5 - 15    Comment: Performed at [facility], [address] [place], [place] [address]  CBC     Status: Abnormal   Collection Time: 05/25/21 11:50 AM  Result Value Ref Range   WBC 9.1 4.0 - 10.5 K/uL   RBC 4.96 3.87 - 5.11 MIL/uL   Hemoglobin 14.7 12.0 - 15.0 g/dL   HCT 40.8 14.4 - 81.8 %   MCV 91.7 80.0 - 100.0 fL   MCH 29.6 26.0 - 34.0 pg   MCHC 32.3 30.0 - 36.0 g/dL   RDW 56.3 14.9 - 70.2 %   Platelets 142 (L) 150 - 400 K/uL   nRBC 0.0 0.0 - 0.2 %    Comment: Performed at [facility], [address], [place], [place] [address]  I-Stat beta hCG blood, ED     Status: None   Collection Time: 05/25/21 12:45 PM  Result Value Ref Range   I-stat hCG, quantitative <5.0 <5 mIU/mL   Comment 3             Comment:   GEST. AGE      CONC.  (mIU/mL)   <=1 WEEK        5 - 50     2 WEEKS       50 - 500     3 WEEKS       100 - 10,000     4 WEEKS     1,000 - 30,000        FEMALE AND NON-PREGNANT FEMALE:     LESS THAN 5 mIU/mL   Hepatitis panel, acute     Status: None   Collection Time: 05/25/21  2:02 PM  Result Value Ref Range   Hepatitis B Surface Ag NON REACTIVE NON REACTIVE   HCV Ab NON REACTIVE NON REACTIVE    Comment: (NOTE) Nonreactive HCV antibody screen is consistent with no HCV infections,  unless recent infection is suspected or other evidence exists to indicate HCV infection.     Hep A IgM NON REACTIVE NON REACTIVE   Hep B C IgM NON REACTIVE NON REACTIVE    Comment: Performed at [facility], [address], [place], [place] [address]    CT ABDOMEN PELVIS W CONTRAST  Result Date: 05/25/2021 CLINICAL DATA:  Transaminitis, abdominal pain EXAM: CT ABDOMEN AND PELVIS WITH CONTRAST TECHNIQUE: Multidetector CT imaging of the abdomen and pelvis was performed using the standard protocol following bolus administration of intravenous contrast. RADIATION DOSE REDUCTION: This exam was performed according to the departmental dose-optimization program which includes automated exposure control, adjustment of the mA and/or kV according to patient size and/or use of iterative reconstruction technique. CONTRAST:  17mL OMNIPAQUE IOHEXOL 300 MG/ML  SOLN COMPARISON:  None Available. FINDINGS: Lower chest: No acute abnormality. Hepatobiliary: Liver is enlarged measuring 18.4 cm in length. No focal hepatic mass identified. Gallbladder is normal size and appears to contain mild increased densities, possibly cholelithiasis or sludge. There is diffuse gallbladder wall thickening. No biliary ductal dilatation. Pancreas: Unremarkable. No pancreatic ductal dilatation or surrounding inflammatory changes. Spleen: Enlarged measuring 13.5 cm in length. Adrenals/Urinary Tract: Adrenal glands are unremarkable.  Kidneys are normal, without renal calculi, focal lesion, or hydronephrosis. Bladder is unremarkable. Stomach/Bowel: No bowel obstruction, free air or pneumatosis. No bowel wall edema identified. Moderate amount of retained fecal  material in the colon. Appendix is normal. Vascular/Lymphatic: No significant vascular findings are present. No enlarged abdominal or pelvic lymph nodes. Reproductive: Diffuse prominent myometrial vascularity as well as prominent left adnexal vascularity and ovarian vein which measures up to 7 mm in diameter. No suspicious adnexal mass identified. Other: No ascites. Musculoskeletal: No acute or significant osseous findings. IMPRESSION: 1. Hepatosplenomegaly.  No biliary ductal dilatation. 2. Gallbladder wall thickening and possible cholelithiasis or sludge. Consider follow-up right upper quadrant ultrasound. 3. Prominent myometrial and left adnexal vascularity which is nonspecific and can be seen with pelvic congestion syndrome, correlate clinically. Electronically Signed   By: Jannifer Hick M.D.   On: 05/25/2021 15:15   US Abdomen Limited  Result Date: 05/25/2021 CLINICAL DATA:  Transaminitis. EXAM: ULTRASOUND ABDOMEN LIMITED RIGHT UPPER QUADRANT COMPARISON:  CT abdomen and pelvis 05/25/2021. FINDINGS: Gallbladder: No gallstones are identified. There is gallbladder wall thickening measuring up to 4 mm with mild pericholecystic fluid. Sonographic Eulah Pont sign is positive per sonographer. Common bile duct: Diameter: 1.9 mm. Liver: No focal lesion identified. Within normal limits in parenchymal echogenicity. Portal vein is patent on color Doppler imaging with normal direction of blood flow towards the liver. Other: None. IMPRESSION: 1. No gallstones. Gallbladder wall thickening, pericholecystic fluid and positive sonographic Murphy sign. Findings are concerning for acute acalculous cholecystitis. Electronically Signed   By: Darliss Cheney M.D.   On: 05/25/2021 17:10    Review of  Systems  Constitutional:  Positive for appetite change.  HENT: Negative.    Eyes:        Yellow eyes  Respiratory:         Feels like she cannot take a deep breath  Cardiovascular: Negative.   Gastrointestinal:  Positive for abdominal pain and nausea. Negative for vomiting.  Endocrine: Negative.   Genitourinary:        Dark urine  Musculoskeletal: Negative.   Skin: Negative.   Allergic/Immunologic: Negative.   Neurological: Negative.   Hematological: Negative.   Psychiatric/Behavioral: Negative.    Blood pressure 133/88, pulse 84, temperature 98.1 F (36.7 C), temperature source Oral, resp. rate 18, height 5' (1.524 m), weight 49.9 kg, SpO2 99 %. Physical Exam Constitutional:      General: She is not in acute distress.    Appearance: She is normal weight.  HENT:     Head: Normocephalic.  Eyes:     General: Scleral icterus present.     Extraocular Movements: Extraocular movements intact.     Pupils: Pupils are equal, round, and reactive to light.  Cardiovascular:     Rate and Rhythm: Normal rate and regular rhythm.     Heart sounds: No murmur heard. Pulmonary:     Effort: No respiratory distress.     Breath sounds: No wheezing or rhonchi.  Abdominal:     General: Abdomen is flat. Bowel sounds are decreased. There is no distension.     Palpations: Abdomen is soft.     Tenderness: There is abdominal tenderness in the right upper quadrant and epigastric area. There is no guarding.     Hernia: No hernia is present.  Skin:    General: Skin is warm.  Neurological:     Mental Status: She is alert and oriented to person, place, and time.  Psychiatric:        Mood and Affect: Mood normal.    Assessment/Plan: Transaminitis, hyperbilirubinemia, ultrasound with findings of acalculus cholecystitis  Agree with medical admission and IV antibiotics.  Recommend GI consultation.  This  may be due to a common duct stone.  Repeat LFTs in AM.  We will plan cholecystectomy this admission  once her common bile duct issue is elucidated.  I spoke with her mother at the bedside.  Liz MaladyBurke E Quinzell Malcomb 05/25/2021, 5:44 PM

## 2021-05-26 ENCOUNTER — Encounter (HOSPITAL_COMMUNITY): Payer: Self-pay | Admitting: Internal Medicine

## 2021-05-26 ENCOUNTER — Observation Stay (HOSPITAL_COMMUNITY): Payer: BC Managed Care – PPO

## 2021-05-26 DIAGNOSIS — K810 Acute cholecystitis: Secondary | ICD-10-CM | POA: Diagnosis present

## 2021-05-26 DIAGNOSIS — F419 Anxiety disorder, unspecified: Secondary | ICD-10-CM | POA: Diagnosis present

## 2021-05-26 DIAGNOSIS — R7401 Elevation of levels of liver transaminase levels: Secondary | ICD-10-CM | POA: Diagnosis present

## 2021-05-26 DIAGNOSIS — R160 Hepatomegaly, not elsewhere classified: Secondary | ICD-10-CM | POA: Diagnosis not present

## 2021-05-26 DIAGNOSIS — K828 Other specified diseases of gallbladder: Secondary | ICD-10-CM | POA: Diagnosis not present

## 2021-05-26 DIAGNOSIS — Y829 Unspecified medical devices associated with adverse incidents: Secondary | ICD-10-CM | POA: Diagnosis present

## 2021-05-26 DIAGNOSIS — K819 Cholecystitis, unspecified: Secondary | ICD-10-CM | POA: Diagnosis not present

## 2021-05-26 DIAGNOSIS — T8389XA Other specified complication of genitourinary prosthetic devices, implants and grafts, initial encounter: Secondary | ICD-10-CM | POA: Diagnosis present

## 2021-05-26 DIAGNOSIS — Z20822 Contact with and (suspected) exposure to covid-19: Secondary | ICD-10-CM | POA: Diagnosis present

## 2021-05-26 DIAGNOSIS — K7589 Other specified inflammatory liver diseases: Secondary | ICD-10-CM | POA: Diagnosis present

## 2021-05-26 DIAGNOSIS — R161 Splenomegaly, not elsewhere classified: Secondary | ICD-10-CM | POA: Diagnosis not present

## 2021-05-26 DIAGNOSIS — Z880 Allergy status to penicillin: Secondary | ICD-10-CM | POA: Diagnosis not present

## 2021-05-26 DIAGNOSIS — Z79899 Other long term (current) drug therapy: Secondary | ICD-10-CM | POA: Diagnosis not present

## 2021-05-26 DIAGNOSIS — F32A Depression, unspecified: Secondary | ICD-10-CM | POA: Diagnosis present

## 2021-05-26 LAB — RESPIRATORY PANEL BY PCR

## 2021-05-26 LAB — CBC
HCT: 38.3 % (ref 36.0–46.0)
Hemoglobin: 12.9 g/dL (ref 12.0–15.0)
MCH: 30.1 pg (ref 26.0–34.0)
MCHC: 33.7 g/dL (ref 30.0–36.0)
MCV: 89.3 fL (ref 80.0–100.0)
Platelets: 142 10*3/uL — ABNORMAL LOW (ref 150–400)
RBC: 4.29 MIL/uL (ref 3.87–5.11)
RDW: 13.3 % (ref 11.5–15.5)
WBC: 6.8 10*3/uL (ref 4.0–10.5)
nRBC: 0 % (ref 0.0–0.2)

## 2021-05-26 LAB — COMPREHENSIVE METABOLIC PANEL
ALT: 279 U/L — ABNORMAL HIGH (ref 0–44)
AST: 170 U/L — ABNORMAL HIGH (ref 15–41)
Albumin: 3 g/dL — ABNORMAL LOW (ref 3.5–5.0)
Alkaline Phosphatase: 403 U/L — ABNORMAL HIGH (ref 38–126)
Anion gap: 7 (ref 5–15)
BUN: <5 mg/dL — ABNORMAL LOW (ref 6–20)
CO2: 24 mmol/L (ref 22–32)
Calcium: 8.9 mg/dL (ref 8.9–10.3)
Chloride: 106 mmol/L (ref 98–111)
Creatinine, Ser: 0.65 mg/dL (ref 0.44–1.00)
GFR, Estimated: >60 mL/min (ref >60)
Glucose, Bld: 99 mg/dL (ref 70–99)
Potassium: 4.4 mmol/L (ref 3.5–5.1)
Sodium: 137 mmol/L (ref 135–145)
Total Bilirubin: 3.9 mg/dL — ABNORMAL HIGH (ref 0.3–1.2)
Total Protein: 5.9 g/dL — ABNORMAL LOW (ref 6.5–8.1)

## 2021-05-26 LAB — PHOSPHORUS: Phosphorus: 4.2 mg/dL (ref 2.5–4.6)

## 2021-05-26 LAB — MAGNESIUM: Magnesium: 1.9 mg/dL (ref 1.7–2.4)

## 2021-05-26 LAB — PROTIME-INR
INR: 0.9 (ref 0.8–1.2)
Prothrombin Time: 12.3 seconds (ref 11.4–15.2)

## 2021-05-26 LAB — HIV ANTIBODY (ROUTINE TESTING W REFLEX): HIV Screen 4th Generation wRfx: NONREACTIVE

## 2021-05-26 MED ORDER — NALOXONE HCL 0.4 MG/ML IJ SOLN: 0.4000 mg | INTRAMUSCULAR | Status: DC | PRN

## 2021-05-26 MED ORDER — GADOBUTROL 1 MMOL/ML IV SOLN
4.5000 mL | Freq: Once | INTRAVENOUS | Status: AC | PRN
  Administered 2021-05-26: 4.5 mL via INTRAVENOUS

## 2021-05-26 MED ORDER — HYDROMORPHONE HCL 1 MG/ML IJ SOLN
1.0000 mg | INTRAMUSCULAR | Status: DC | PRN
  Administered 2021-05-26 – 2021-05-31 (×16): 1 mg via INTRAVENOUS
  Filled 2021-05-26 (×16): qty 1

## 2021-05-26 MED ORDER — MORPHINE SULFATE (PF) 2 MG/ML IV SOLN
1.0000 mg | INTRAVENOUS | Status: DC | PRN
  Administered 2021-05-26: 2 mg via INTRAVENOUS
  Filled 2021-05-26: qty 1

## 2021-05-26 NOTE — Progress Notes (Addendum)
PROGRESS NOTE        PATIENT DETAILS Name: Bethany Rose Age: 21 y.o. Sex: female Date of Birth: [birth year] Admit Date: 05/25/2021 Admitting Physician Therisa Doyne, MD PCP:[facility], [facility]  Brief Summary: Patient is a 21 y.o.  female with no past medical history-presenting with 1 week history of mostly RUQ pain and intermittent nausea/vomiting.  She was found to have acute cholecystitis and obstructive jaundice.  She was subsequently admitted to [facility] for further evaluation and treatment.   Significant events: 5/25>> admit to [facility]-acute cholecystitis with obstructive jaundice.  Significant studies: 5/25>> CT abdomen/pelvis: Hepatosplenomegaly-no biliary ductal dilatation.  Gallbladder wall thickening with possible cholelithiasis/sludge. 5/25>> RUQ ultrasound: Gallbladder thickening with pericholecystic fluid and positive sonographic Murphy sign.  Significant microbiology data: 5/25>>  COVID PCR: Negative 5/25>> blood culture: No growth 5/25>> hepatitis A/B/C serology: Negative  Procedures: None  Consults: GI, general surgery  Subjective: Complains of persistent abdominal pain-mostly in the right upper abdominal area.  Objective: Vitals: Blood pressure 127/75, pulse 88, temperature 97.6 F (36.4 C), temperature source Oral, resp. rate 16, height 5' (1.524 m), weight 49.9 kg, last menstrual period 05/20/2021, SpO2 96 %.   Exam: Gen Exam:Alert awake-not in any distress HEENT:atraumatic, normocephalic Chest: B/L clear to auscultation anteriorly CVS:S1S2 regular Abdomen:soft-some mild guarding in the right abdominal area-tender mostly in the right upper/mid/epigastric area. Extremities:no edema Neurology: Non focal Skin: no rash  Pertinent Labs/Radiology:    Latest Ref Rng & Units 05/26/2021    4:01 AM 05/25/2021    8:02 PM 05/25/2021   11:50 AM  CBC  WBC 4.0 - 10.5 K/uL 6.8   7.7   9.1    Hemoglobin 12.0 - 15.0 g/dL  16.1   09.6   04.5    Hematocrit 36.0 - 46.0 % 38.3   39.9   45.5    Platelets 150 - 400 K/uL 142   137   142      Lab Results  Component Value Date   NA 137 05/26/2021   K 4.4 05/26/2021   CL 106 05/26/2021   CO2 24 05/26/2021      Assessment/Plan: Acute acalculous cholecystitis with cholestatic pattern of jaundice: Continues to have RUQ pain-some nausea persists.  Continue n.p.o. status-continue IV antibiotics/IVF-MRI abdomen ordered to rule out CBD stone (although no biliary ductal dilatation seen on CT abdomen).  Await further recommendations from GI and general surgery.  Hepatosplenomegaly: Unclear etiology-unclear whether this is the primary process that is causing cholestatic pattern and RUQ pain.  Await MRI abdomen.  Await GI evaluation.  Hypomagnesemia: Repleted.  Recheck periodically.  BMI: Estimated body mass index is 21.48 kg/m as calculated from the following:   Height as of this encounter: 5' (1.524 m).   Weight as of this encounter: 49.9 kg.   Code status:   Code Status: Full Code   DVT Prophylaxis: SCDs Start: 05/25/21 2050   Family Communication: Mother at bedside   Disposition Plan: Status is: Observation The patient will require care spanning > 2 midnights and should be moved to inpatient because: Cholecystitis-on IV antibiotics-awaiting GI evaluation/MRCP-likely will require laparoscopic cholecystectomy prior to discharge.  Not stable for discharge-likely requires another 1-2 days of hospitalization.   Planned Discharge Destination:Home   Diet: Diet Order             Diet NPO time specified Except  for: Sips with Meds  Diet effective now                     Antimicrobial agents: Anti-infectives (From admission, onward)    Start     Dose/Rate Route Frequency Ordered Stop   05/26/21 0600  metroNIDAZOLE (FLAGYL) IVPB 500 mg        500 mg 100 mL/hr over 60 Minutes Intravenous Every 12 hours 05/25/21 1902     05/26/21 0600  ciprofloxacin  (CIPRO) IVPB 400 mg        400 mg 200 mL/hr over 60 Minutes Intravenous Every 12 hours 05/25/21 1903     05/25/21 1730  ciprofloxacin (CIPRO) IVPB 400 mg       See Hyperspace for full Linked Orders Report.   400 mg 200 mL/hr over 60 Minutes Intravenous  Once 05/25/21 1723 05/25/21 2217   05/25/21 1730  metroNIDAZOLE (FLAGYL) IVPB 500 mg       See Hyperspace for full Linked Orders Report.   500 mg 100 mL/hr over 60 Minutes Intravenous  Once 05/25/21 1723 05/25/21 2048        MEDICATIONS: Scheduled Meds: Continuous Infusions:  ciprofloxacin 400 mg (05/26/21 0719)   metronidazole 500 mg (05/26/21 0600)   PRN Meds:.morphine injection, naLOXone (NARCAN)  injection, ondansetron **OR** ondansetron (ZOFRAN) IV   I have personally reviewed following labs and imaging studies  LABORATORY DATA: CBC: Recent Labs  Lab 05/25/21 1150 05/25/21 2002 05/26/21 0401  WBC 9.1 7.7 6.8  NEUTROABS  --  2.6  --   HGB 14.7 12.8 12.9  HCT 45.5 39.9 38.3  MCV 91.7 93.0 89.3  PLT 142* 137* 142*    Basic Metabolic Panel: Recent Labs  Lab 05/25/21 1150 05/25/21 2002 05/26/21 0401  NA 138  --  137  K 3.8  --  4.4  CL 106  --  106  CO2 22  --  24  GLUCOSE 128*  --  99  BUN 6  --  <5*  CREATININE 0.66  --  0.65  CALCIUM 9.1  --  8.9  MG  --  1.6* 1.9  PHOS  --  3.9 4.2    GFR: Estimated Creatinine Clearance: 80.6 mL/min (by C-G formula based on SCr of 0.65 mg/dL).  Liver Function Tests: Recent Labs  Lab 05/25/21 1150 05/26/21 0401  AST 296* 170*  ALT 376* 279*  ALKPHOS 500* 403*  BILITOT 5.6* 3.9*  PROT 6.8 5.9*  ALBUMIN 3.4* 3.0*   Recent Labs  Lab 05/25/21 1150  LIPASE 31   Recent Labs  Lab 05/25/21 2002  AMMONIA 30    Coagulation Profile: Recent Labs  Lab 05/25/21 2002 05/26/21 0401  INR 1.0 0.9    Cardiac Enzymes: Recent Labs  Lab 05/25/21 2002  CKTOTAL 24*    BNP (last 3 results) No results for input(s): PROBNP in the last 8760 hours.  Lipid  Profile: No results for input(s): CHOL, HDL, LDLCALC, TRIG, CHOLHDL, LDLDIRECT in the last 72 hours.  Thyroid Function Tests: Recent Labs    05/25/21 2002  TSH 1.948    Anemia Panel: No results for input(s): VITAMINB12, FOLATE, FERRITIN, TIBC, IRON, RETICCTPCT in the last 72 hours.  Urine analysis:    Component Value Date/Time   COLORURINE AMBER (A) 05/25/2021 1143   APPEARANCEUR CLEAR 05/25/2021 1143   LABSPEC 1.015 05/25/2021 1143   PHURINE 5.0 05/25/2021 1143   GLUCOSEU NEGATIVE 05/25/2021 1143   HGBUR NEGATIVE 05/25/2021 1143   BILIRUBINUR  SMALL (A) 05/25/2021 1143   KETONESUR NEGATIVE 05/25/2021 1143   PROTEINUR NEGATIVE 05/25/2021 1143   NITRITE NEGATIVE 05/25/2021 1143   LEUKOCYTESUR NEGATIVE 05/25/2021 1143    Sepsis Labs: Lactic Acid, Venous No results found for: LATICACIDVEN  MICROBIOLOGY: Recent Results (from the past 240 hour(s))  Blood culture (routine x 2)     Status: None (Preliminary result)   Collection Time: 05/25/21  5:24 PM   Specimen: BLOOD LEFT ARM  Result Value Ref Range Status   Specimen Description BLOOD LEFT ARM  Final   Special Requests   Final    BOTTLES DRAWN AEROBIC AND ANAEROBIC Blood Culture results may not be optimal due to an inadequate volume of blood received in culture bottles   Culture   Final    NO GROWTH < 24 HOURS Performed at [facility], [address] [place], [place] [address]    Report Status PENDING  Incomplete  Blood culture (routine x 2)     Status: None (Preliminary result)   Collection Time: 05/25/21  5:44 PM   Specimen: BLOOD RIGHT HAND  Result Value Ref Range Status   Specimen Description BLOOD RIGHT HAND  Final   Special Requests   Final    BOTTLES DRAWN AEROBIC AND ANAEROBIC Blood Culture adequate volume   Culture   Final    NO GROWTH < 24 HOURS Performed at [facility], [address], [place], [place] [address]    Report Status PENDING  Incomplete  SARS Coronavirus 2 by RT PCR (hospital  order, performed in [facility] hospital lab) *cepheid single result test* Anterior Nasal Swab     Status: None   Collection Time: 05/25/21  6:46 PM   Specimen: Anterior Nasal Swab  Result Value Ref Range Status   SARS Coronavirus 2 by RT PCR NEGATIVE NEGATIVE Final    Comment: (NOTE) SARS-CoV-2 target nucleic acids are NOT DETECTED.  The SARS-CoV-2 RNA is generally detectable in upper and lower respiratory specimens during the acute phase of infection. The lowest concentration of SARS-CoV-2 viral copies this assay can detect is 250 copies / mL. A negative result does not preclude SARS-CoV-2 infection and should not be used as the sole basis for treatment or other patient management decisions.  A negative result may occur with improper specimen collection / handling, submission of specimen other than nasopharyngeal swab, presence of viral mutation(s) within the areas targeted by this assay, and inadequate number of viral copies (<250 copies / mL). A negative result must be combined with clinical observations, patient history, and epidemiological information.  Fact Sheet for Patients:   RoadLapTop.co.za  Fact Sheet for Healthcare Providers: http://kim-miller.com/  This test is not yet approved or  cleared by the [place] FDA and has been authorized for detection and/or diagnosis of SARS-CoV-2 by FDA under an Emergency Use Authorization (EUA).  This EUA will remain in effect (meaning this test can be used) for the duration of the COVID-19 declaration under Section 564(b)(1) of the Act, 21 U.S.C. section 360bbb-3(b)(1), unless the authorization is terminated or revoked sooner.  Performed at [facility], [address] [place], [place] [address]     RADIOLOGY STUDIES/RESULTS: CT ABDOMEN PELVIS W CONTRAST  Result Date: 05/25/2021 CLINICAL DATA:  Transaminitis, abdominal pain EXAM: CT ABDOMEN AND PELVIS WITH CONTRAST  TECHNIQUE: Multidetector CT imaging of the abdomen and pelvis was performed using the standard protocol following bolus administration of intravenous contrast. RADIATION DOSE REDUCTION: This exam was performed  according to the departmental dose-optimization program which includes automated exposure control, adjustment of the mA and/or kV according to patient size and/or use of iterative reconstruction technique. CONTRAST:  70mL OMNIPAQUE IOHEXOL 300 MG/ML  SOLN COMPARISON:  None Available. FINDINGS: Lower chest: No acute abnormality. Hepatobiliary: Liver is enlarged measuring 18.4 cm in length. No focal hepatic mass identified. Gallbladder is normal size and appears to contain mild increased densities, possibly cholelithiasis or sludge. There is diffuse gallbladder wall thickening. No biliary ductal dilatation. Pancreas: Unremarkable. No pancreatic ductal dilatation or surrounding inflammatory changes. Spleen: Enlarged measuring 13.5 cm in length. Adrenals/Urinary Tract: Adrenal glands are unremarkable. Kidneys are normal, without renal calculi, focal lesion, or hydronephrosis. Bladder is unremarkable. Stomach/Bowel: No bowel obstruction, free air or pneumatosis. No bowel wall edema identified. Moderate amount of retained fecal material in the colon. Appendix is normal. Vascular/Lymphatic: No significant vascular findings are present. No enlarged abdominal or pelvic lymph nodes. Reproductive: Diffuse prominent myometrial vascularity as well as prominent left adnexal vascularity and ovarian vein which measures up to 7 mm in diameter. No suspicious adnexal mass identified. Other: No ascites. Musculoskeletal: No acute or significant osseous findings. IMPRESSION: 1. Hepatosplenomegaly.  No biliary ductal dilatation. 2. Gallbladder wall thickening and possible cholelithiasis or sludge. Consider follow-up right upper quadrant ultrasound. 3. Prominent myometrial and left adnexal vascularity which is nonspecific and can be  seen with pelvic congestion syndrome, correlate clinically. Electronically Signed   By: Jannifer Hickelaney  Williams M.D.   On: 05/25/2021 15:15   US Abdomen Limited  Result Date: 05/25/2021 CLINICAL DATA:  Transaminitis. EXAM: ULTRASOUND ABDOMEN LIMITED RIGHT UPPER QUADRANT COMPARISON:  CT abdomen and pelvis 05/25/2021. FINDINGS: Gallbladder: No gallstones are identified. There is gallbladder wall thickening measuring up to 4 mm with mild pericholecystic fluid. Sonographic Eulah PontMurphy sign is positive per sonographer. Common bile duct: Diameter: 1.9 mm. Liver: No focal lesion identified. Within normal limits in parenchymal echogenicity. Portal vein is patent on color Doppler imaging with normal direction of blood flow towards the liver. Other: None. IMPRESSION: 1. No gallstones. Gallbladder wall thickening, pericholecystic fluid and positive sonographic Murphy sign. Findings are concerning for acute acalculous cholecystitis. Electronically Signed   By: Darliss CheneyAmy  Guttmann M.D.   On: 05/25/2021 17:10     LOS: 0 days   Jeoffrey MassedShanker Fantasia Jinkins, MD  [facility]    To contact the attending provider between 7A-7P or the covering provider during after hours 7P-7A, please log into the web site [email or web address] and access using universal [place] password for that web site. If you do not have the password, please call the hospital operator.  05/26/2021, 8:01 AM

## 2021-05-26 NOTE — Progress Notes (Signed)
Patient reporting that her pain was help a little with morphine. She is requesting more. Notified MD.

## 2021-05-26 NOTE — Consult Note (Signed)
Referring Provider: [facility] Primary Care Physician:  [facility] Primary Gastroenterologist: Gentry Fitz  Reason for Consultation: Elevated LFTs, abdominal pain  HPI: Bethany Rose is a 21 y.o. female no significant past medical history who presented to the emergency room 05/25/2021 for abdominal pain.   Patient reports on Monday she received the Nexplanon implant.  Soon after she began to have lower abdominal cramping and pains as well as right upper quadrant pain worse with deep breaths.  She also began to notice pruritus of the hands and feet and dark urine.  She returned to her OB yesterday who removed the Nexplanon.  OB noticed patient with mild jaundice ordered LFTs and instructed patient to present to ED.   Patient reports a family history of gallstone pancreatitis in her father.  Notes her grandmother also had gallbladder issues.  No previous EGD or colonoscopy Denies frequent NSAID use.  Reports occasional alcohol use denies smoking.  Denies drug use.  Patient finished her sophomore year of college in [place] recently. Past Medical History:  Diagnosis Date   Allergy    Headache     History reviewed. No pertinent surgical history.  Prior to Admission medications   Medication Sig Start Date End Date Taking? Authorizing Provider  amphetamine-dextroamphetamine (ADDERALL XR) 30 MG 24 hr capsule Take 30 mg by mouth every morning. 05/01/21  Yes [provider]  cetirizine (ZYRTEC) 10 MG tablet Take 10 mg by mouth daily.   Yes [provider]  fluticasone (FLONASE) 50 MCG/ACT nasal spray Place 2 sprays into the nose daily.   Yes [provider]  JUNEL FE 1/20 1-20 MG-MCG tablet Take 1 tablet by mouth daily. 04/04/21  Yes [provider]  montelukast (SINGULAIR) 10 MG tablet Take 10 mg by mouth daily.   Yes [provider]  sertraline (ZOLOFT) 100 MG tablet Take 100 mg by mouth daily. 04/28/21  Yes [provider]  traZODone (DESYREL) 50 MG tablet Take 50 mg by mouth at bedtime. 02/24/21  Yes [provider]    Scheduled Meds: Continuous Infusions:  ciprofloxacin 400 mg (05/26/21 0719)   metronidazole 500 mg (05/26/21 0600)   PRN Meds:.HYDROmorphone (DILAUDID) injection, naLOXone (NARCAN)  injection, ondansetron **OR** ondansetron (ZOFRAN) IV  Allergies as of 05/25/2021 - Review Complete 05/25/2021  Allergen Reaction Noted   Amoxicillin Hives 10/02/2019   Penicillins Hives 08/25/2011    Family History  Problem Relation Age of Onset   ADD / ADHD Brother     Social History   Socioeconomic History   Marital status: Single    Spouse name: n/a   Number of children: 0   Years of education: Not on file   Highest education level: Not on file  Occupational History   Occupation: student    Comment: [employer]  Tobacco Use   Smoking status: Never   Smokeless tobacco: Never  Substance and Sexual Activity   Alcohol use: No   Drug use: No   Sexual activity: Never  Other Topics Concern   Not on file  Social History Narrative   Lives with both parents and twin brother in the same house.  Mother is a 3rd-4th grade Runner, broadcasting/film/video at [employer].  Father works in Music therapist.   Social Determinants of Health   Financial Resource Strain: Not on file  Food Insecurity: Not on file  Transportation Needs: Not on file  Physical Activity: Not on file  Stress: Not on file  Social Connections: Not on file  Intimate Partner Violence: Not on file    Review of Systems: Review of Systems  Constitutional:  Positive for malaise/fatigue. Negative for chills and fever.  HENT:  Negative for hearing loss and tinnitus.   Eyes:  Negative for blurred vision and double vision.  Respiratory:  Negative for cough and hemoptysis.   Cardiovascular:  Negative for chest pain and palpitations.  Gastrointestinal:  Positive for abdominal pain and nausea. Negative for  blood in stool, constipation, diarrhea, heartburn, melena and vomiting.  Genitourinary:  Negative for dysuria and urgency.  Musculoskeletal:  Negative for myalgias and neck pain.  Skin:  Positive for itching. Negative for rash.  Neurological:  Positive for weakness. Negative for dizziness and headaches.  Endo/Heme/Allergies:  Negative for environmental allergies. Does not bruise/bleed easily.  Psychiatric/Behavioral:  Negative for depression, substance abuse and suicidal ideas.     Physical Exam:Physical Exam Constitutional:      General: She is not in acute distress.    Appearance: Normal appearance. She is normal weight.  HENT:     Head: Normocephalic and atraumatic.     Right Ear: External ear normal.     Left Ear: External ear normal.     Nose: Nose normal.     Mouth/Throat:     Mouth: Mucous membranes are moist.  Eyes:     Pupils: Pupils are equal, round, and reactive to light.  Cardiovascular:     Rate and Rhythm: Normal rate and regular rhythm.     Pulses: Normal pulses.     Heart sounds: Normal heart sounds.  Pulmonary:     Effort: Pulmonary effort is normal.     Breath sounds: Normal breath sounds.  Abdominal:     General: Abdomen is flat. Bowel sounds are normal. There is no distension.     Palpations: Abdomen is soft. There is no mass.     Tenderness: There is no abdominal tenderness. There is no guarding or rebound.     Hernia: No hernia is present.  Musculoskeletal:        General: No swelling. Normal range of motion.     Cervical back: Normal range of motion and neck supple.  Skin:    General: Skin is warm and dry.     Coloration: Skin is pale.  Neurological:     General: No focal deficit present.     Mental Status: She is alert and oriented to person, place, and time. Mental status is at baseline.  Psychiatric:        Mood and Affect: Mood normal.        Behavior: Behavior normal.    Vital signs: Vitals:   05/26/21 0801 05/26/21 1156  BP: 119/67 (!)  121/56  Pulse: 96 85  Resp: 17 17  Temp: 98.2 F (36.8 C) 98 F (36.7 C)  SpO2: 100%    Last BM Date : 05/25/21    GI:  Lab Results: Recent Labs    05/25/21 1150 05/25/21 2002 05/26/21 0401  WBC 9.1 7.7 6.8  HGB 14.7 12.8 12.9  HCT 45.5 39.9 38.3  PLT 142* 137* 142*   BMET Recent Labs    05/25/21 1150 05/26/21 0401  NA 138 137  K 3.8 4.4  CL 106 106  CO2 22 24  GLUCOSE 128* 99  BUN 6 <5*  CREATININE 0.66 0.65  CALCIUM 9.1 8.9   LFT Recent Labs    05/25/21 2002 05/26/21 0401  PROT  --  5.9*  ALBUMIN  --  3.0*  AST  --  170*  ALT  --  279*  ALKPHOS  --  403*  BILITOT  --  3.9*  BILIDIR 2.6*  --    PT/INR Recent Labs    05/25/21 2002 05/26/21 0401  LABPROT 12.8 12.3  INR 1.0 0.9     Studies/Results: CT ABDOMEN PELVIS W CONTRAST  Result Date: 05/25/2021 CLINICAL DATA:  Transaminitis, abdominal pain EXAM: CT ABDOMEN AND PELVIS WITH CONTRAST TECHNIQUE: Multidetector CT imaging of the abdomen and pelvis was performed using the standard protocol following bolus administration of intravenous contrast. RADIATION DOSE REDUCTION: This exam was performed according to the departmental dose-optimization program which includes automated exposure control, adjustment of the mA and/or kV according to patient size and/or use of iterative reconstruction technique. CONTRAST:  65mL OMNIPAQUE IOHEXOL 300 MG/ML  SOLN COMPARISON:  None Available. FINDINGS: Lower chest: No acute abnormality. Hepatobiliary: Liver is enlarged measuring 18.4 cm in length. No focal hepatic mass identified. Gallbladder is normal size and appears to contain mild increased densities, possibly cholelithiasis or sludge. There is diffuse gallbladder wall thickening. No biliary ductal dilatation. Pancreas: Unremarkable. No pancreatic ductal dilatation or surrounding inflammatory changes. Spleen: Enlarged measuring 13.5 cm in length. Adrenals/Urinary Tract: Adrenal glands are unremarkable. Kidneys are  normal, without renal calculi, focal lesion, or hydronephrosis. Bladder is unremarkable. Stomach/Bowel: No bowel obstruction, free air or pneumatosis. No bowel wall edema identified. Moderate amount of retained fecal material in the colon. Appendix is normal. Vascular/Lymphatic: No significant vascular findings are present. No enlarged abdominal or pelvic lymph nodes. Reproductive: Diffuse prominent myometrial vascularity as well as prominent left adnexal vascularity and ovarian vein which measures up to 7 mm in diameter. No suspicious adnexal mass identified. Other: No ascites. Musculoskeletal: No acute or significant osseous findings. IMPRESSION: 1. Hepatosplenomegaly.  No biliary ductal dilatation. 2. Gallbladder wall thickening and possible cholelithiasis or sludge. Consider follow-up right upper quadrant ultrasound. 3. Prominent myometrial and left adnexal vascularity which is nonspecific and can be seen with pelvic congestion syndrome, correlate clinically. Electronically Signed   By: Jannifer Hick M.D.   On: 05/25/2021 15:15   MR 3D Recon At Scanner  Result Date: 05/26/2021 CLINICAL DATA:  One week history of right upper quadrant pain and intermittent nausea and vomiting. Acute cholecystitis and obstructive jaundice. EXAM: MRI ABDOMEN WITHOUT AND WITH CONTRAST (INCLUDING MRCP) TECHNIQUE: Multiplanar multisequence MR imaging of the abdomen was performed both before and after the administration of intravenous contrast. Heavily T2-weighted images of the biliary and pancreatic ducts were obtained, and three-dimensional MRCP images were rendered by post processing. CONTRAST:  4.88mL GADAVIST GADOBUTROL 1 MMOL/ML IV SOLN COMPARISON:  Right upper quadrant ultrasound and CT abdomen pelvis dated May 25, 2021 FINDINGS: Lower chest: Heterogeneous signal in the lung bases likely reflects atelectasis. Hepatobiliary: No significant hepatic steatosis. No suspicious hepatic lesion. Hepatomegaly measuring 19.9 cm in  maximum craniocaudal dimension at the midclavicular line. Periportal edema. Distended gallbladder with some pericholecystic fluid. No biliary ductal dilation or overt peribiliary enhancement. Pancreas: Intrinsic T1 signal of the pancreatic parenchyma is within normal limits. No pancreatic ductal dilation. Homogeneous postcontrast enhancement of the pancreas. Spleen:  Splenomegaly measuring 14.6 cm in craniocaudal dimension. Adrenals/Urinary Tract: Bilateral adrenal glands appear normal. No hydronephrosis. No suspicious renal mass. Stomach/Bowel: Visualized portions within the abdomen are unremarkable. Vascular/Lymphatic: No pathologically enlarged lymph nodes identified. No abdominal aortic aneurysm demonstrated. Other:  Trace perihepatic free fluid. Musculoskeletal: No suspicious bone lesions identified. IMPRESSION: 1. Hepatomegaly with periportal  edema and trace perihepatic fluid, possibly reflecting hepatitis. 2. Distended gallbladder with some pericholecystic fluid, favored to reflect sequela of a hepatocellular process however acute cholecystitis is not excluded. Consider more definitive assessment for acute cholecystitis with nuclear medicine HIDA scan. 3. No biliary ductal dilation or choledocholithiasis. 4. Splenomegaly. Electronically Signed   By: Maudry Mayhew M.D.   On: 05/26/2021 11:03   US Abdomen Limited  Result Date: 05/25/2021 CLINICAL DATA:  Transaminitis. EXAM: ULTRASOUND ABDOMEN LIMITED RIGHT UPPER QUADRANT COMPARISON:  CT abdomen and pelvis 05/25/2021. FINDINGS: Gallbladder: No gallstones are identified. There is gallbladder wall thickening measuring up to 4 mm with mild pericholecystic fluid. Sonographic Eulah Pont sign is positive per sonographer. Common bile duct: Diameter: 1.9 mm. Liver: No focal lesion identified. Within normal limits in parenchymal echogenicity. Portal vein is patent on color Doppler imaging with normal direction of blood flow towards the liver. Other: None. IMPRESSION: 1.  No gallstones. Gallbladder wall thickening, pericholecystic fluid and positive sonographic Murphy sign. Findings are concerning for acute acalculous cholecystitis. Electronically Signed   By: Darliss Cheney M.D.   On: 05/25/2021 17:10   MR ABDOMEN MRCP W WO CONTAST  Result Date: 05/26/2021 CLINICAL DATA:  One week history of right upper quadrant pain and intermittent nausea and vomiting. Acute cholecystitis and obstructive jaundice. EXAM: MRI ABDOMEN WITHOUT AND WITH CONTRAST (INCLUDING MRCP) TECHNIQUE: Multiplanar multisequence MR imaging of the abdomen was performed both before and after the administration of intravenous contrast. Heavily T2-weighted images of the biliary and pancreatic ducts were obtained, and three-dimensional MRCP images were rendered by post processing. CONTRAST:  4.92mL GADAVIST GADOBUTROL 1 MMOL/ML IV SOLN COMPARISON:  Right upper quadrant ultrasound and CT abdomen pelvis dated May 25, 2021 FINDINGS: Lower chest: Heterogeneous signal in the lung bases likely reflects atelectasis. Hepatobiliary: No significant hepatic steatosis. No suspicious hepatic lesion. Hepatomegaly measuring 19.9 cm in maximum craniocaudal dimension at the midclavicular line. Periportal edema. Distended gallbladder with some pericholecystic fluid. No biliary ductal dilation or overt peribiliary enhancement. Pancreas: Intrinsic T1 signal of the pancreatic parenchyma is within normal limits. No pancreatic ductal dilation. Homogeneous postcontrast enhancement of the pancreas. Spleen:  Splenomegaly measuring 14.6 cm in craniocaudal dimension. Adrenals/Urinary Tract: Bilateral adrenal glands appear normal. No hydronephrosis. No suspicious renal mass. Stomach/Bowel: Visualized portions within the abdomen are unremarkable. Vascular/Lymphatic: No pathologically enlarged lymph nodes identified. No abdominal aortic aneurysm demonstrated. Other:  Trace perihepatic free fluid. Musculoskeletal: No suspicious bone lesions  identified. IMPRESSION: 1. Hepatomegaly with periportal edema and trace perihepatic fluid, possibly reflecting hepatitis. 2. Distended gallbladder with some pericholecystic fluid, favored to reflect sequela of a hepatocellular process however acute cholecystitis is not excluded. Consider more definitive assessment for acute cholecystitis with nuclear medicine HIDA scan. 3. No biliary ductal dilation or choledocholithiasis. 4. Splenomegaly. Electronically Signed   By: Maudry Mayhew M.D.   On: 05/26/2021 11:03    Impression: RUQ and lower abdominal pain Elevated LFTs   CT AP w/ constrast 05/25/2021 1. Hepatosplenomegaly.  No biliary ductal dilatation. 2. Gallbladder wall thickening and possible cholelithiasis or sludge. Consider follow-up right upper quadrant ultrasound. 3. Prominent myometrial and left adnexal vascularity which is nonspecific and can be seen with pelvic congestion syndrome, correlate clinically.   Complete abdominal ultrasound 05/25/2021 1. No gallstones. Gallbladder wall thickening, pericholecystic fluid and positive sonographic Murphy sign. Findings are concerning for acute acalculous cholecystitis.  MRCP 5/26 Hepatomegaly with periportal edema and trace perihepatic fluid, possibly reflecting hepatitis. 2. Distended gallbladder with some pericholecystic fluid, favored to reflect sequela  of a hepatocellular process however acute cholecystitis is not excluded. Consider more definitive assessment for acute cholecystitis with nuclear medicine HIDA scan. 3. No biliary ductal dilation or choledocholithiasis. 4. Splenomegaly.   Findings seem consistent with acute acalculous cholecystitis.  No choledocholithiasis seen on MRCP.  No plan for ERCP at this time.  Patient's LFTs are down trending.  Patient without risk factors for hepatitis.  Denies IV drug use, no previous blood transfusions, no sick contacts.  HGB 12.9 Platelets 142 AST 170(296) ALT 279(376)  Alkphos 403(500) TBili  3.9(5.6)   Plan: Continue supportive care and pain management No plan for procedure from GI perspective at this time. [facility] will follow  LOS: 0 days   Emmit AlexandersGabrielle N Ray Glacken  PA-C 05/26/2021, 12:19 PM  Contact #  [phone]

## 2021-05-26 NOTE — Progress Notes (Signed)
Progress Note     Subjective: Pt in tears this AM complaining of abdominal pain and headache and burning at IV sites. Mother at bedside as well. MRCP ordered already this AM.   Objective: Vital signs in last 24 hours: Temp:  [97 F (36.1 C)-98.2 F (36.8 C)] 98.2 F (36.8 C) (05/26 0801) Pulse Rate:  [79-133] 96 (05/26 0801) Resp:  [12-18] 17 (05/26 0801) BP: (119-140)/(67-91) 119/67 (05/26 0801) SpO2:  [96 %-100 %] 100 % (05/26 0801) Weight:  [49.9 kg] 49.9 kg (05/25 1140) Last BM Date : 05/25/21  Intake/Output from previous day: 05/25 0701 - 05/26 0700 In: 2049.7 [I.V.:400.4; IV Piggyback:1649.4] Out: -  Intake/Output this shift: No intake/output data recorded.  PE: General: WD, WN female who is laying in bed and crying in pain Heart: regular, rate, and rhythm.   Lungs: Respiratory effort nonlabored Abd: soft, ttp in RUQ, ND    Lab Results:  Recent Labs    05/25/21 2002 05/26/21 0401  WBC 7.7 6.8  HGB 12.8 12.9  HCT 39.9 38.3  PLT 137* 142*   BMET Recent Labs    05/25/21 1150 05/26/21 0401  NA 138 137  K 3.8 4.4  CL 106 106  CO2 22 24  GLUCOSE 128* 99  BUN 6 <5*  CREATININE 0.66 0.65  CALCIUM 9.1 8.9   PT/INR Recent Labs    05/25/21 2002 05/26/21 0401  LABPROT 12.8 12.3  INR 1.0 0.9   CMP     Component Value Date/Time   NA 137 05/26/2021 0401   K 4.4 05/26/2021 0401   CL 106 05/26/2021 0401   CO2 24 05/26/2021 0401   GLUCOSE 99 05/26/2021 0401   BUN <5 (L) 05/26/2021 0401   CREATININE 0.65 05/26/2021 0401   CALCIUM 8.9 05/26/2021 0401   PROT 5.9 (L) 05/26/2021 0401   ALBUMIN 3.0 (L) 05/26/2021 0401   AST 170 (H) 05/26/2021 0401   ALT 279 (H) 05/26/2021 0401   ALKPHOS 403 (H) 05/26/2021 0401   BILITOT 3.9 (H) 05/26/2021 0401   GFRNONAA >60 05/26/2021 0401   Lipase     Component Value Date/Time   LIPASE 31 05/25/2021 1150       Studies/Results: CT ABDOMEN PELVIS W CONTRAST  Result Date: 05/25/2021 CLINICAL DATA:   Transaminitis, abdominal pain EXAM: CT ABDOMEN AND PELVIS WITH CONTRAST TECHNIQUE: Multidetector CT imaging of the abdomen and pelvis was performed using the standard protocol following bolus administration of intravenous contrast. RADIATION DOSE REDUCTION: This exam was performed according to the departmental dose-optimization program which includes automated exposure control, adjustment of the mA and/or kV according to patient size and/or use of iterative reconstruction technique. CONTRAST:  73m OMNIPAQUE IOHEXOL 300 MG/ML  SOLN COMPARISON:  None Available. FINDINGS: Lower chest: No acute abnormality. Hepatobiliary: Liver is enlarged measuring 18.4 cm in length. No focal hepatic mass identified. Gallbladder is normal size and appears to contain mild increased densities, possibly cholelithiasis or sludge. There is diffuse gallbladder wall thickening. No biliary ductal dilatation. Pancreas: Unremarkable. No pancreatic ductal dilatation or surrounding inflammatory changes. Spleen: Enlarged measuring 13.5 cm in length. Adrenals/Urinary Tract: Adrenal glands are unremarkable. Kidneys are normal, without renal calculi, focal lesion, or hydronephrosis. Bladder is unremarkable. Stomach/Bowel: No bowel obstruction, free air or pneumatosis. No bowel wall edema identified. Moderate amount of retained fecal material in the colon. Appendix is normal. Vascular/Lymphatic: No significant vascular findings are present. No enlarged abdominal or pelvic lymph nodes. Reproductive: Diffuse prominent myometrial vascularity as well as prominent  left adnexal vascularity and ovarian vein which measures up to 7 mm in diameter. No suspicious adnexal mass identified. Other: No ascites. Musculoskeletal: No acute or significant osseous findings. IMPRESSION: 1. Hepatosplenomegaly.  No biliary ductal dilatation. 2. Gallbladder wall thickening and possible cholelithiasis or sludge. Consider follow-up right upper quadrant ultrasound. 3. Prominent  myometrial and left adnexal vascularity which is nonspecific and can be seen with pelvic congestion syndrome, correlate clinically. Electronically Signed   By: Ofilia Neas M.D.   On: 05/25/2021 15:15   US Abdomen Limited  Result Date: 05/25/2021 CLINICAL DATA:  Transaminitis. EXAM: ULTRASOUND ABDOMEN LIMITED RIGHT UPPER QUADRANT COMPARISON:  CT abdomen and pelvis 05/25/2021. FINDINGS: Gallbladder: No gallstones are identified. There is gallbladder wall thickening measuring up to 4 mm with mild pericholecystic fluid. Sonographic Percell Miller sign is positive per sonographer. Common bile duct: Diameter: 1.9 mm. Liver: No focal lesion identified. Within normal limits in parenchymal echogenicity. Portal vein is patent on color Doppler imaging with normal direction of blood flow towards the liver. Other: None. IMPRESSION: 1. No gallstones. Gallbladder wall thickening, pericholecystic fluid and positive sonographic Murphy sign. Findings are concerning for acute acalculous cholecystitis. Electronically Signed   By: Ronney Asters M.D.   On: 05/25/2021 17:10    Anti-infectives: Anti-infectives (From admission, onward)    Start     Dose/Rate Route Frequency Ordered Stop   05/26/21 0600  metroNIDAZOLE (FLAGYL) IVPB 500 mg        500 mg 100 mL/hr over 60 Minutes Intravenous Every 12 hours 05/25/21 1902     05/26/21 0600  ciprofloxacin (CIPRO) IVPB 400 mg        400 mg 200 mL/hr over 60 Minutes Intravenous Every 12 hours 05/25/21 1903     05/25/21 1730  ciprofloxacin (CIPRO) IVPB 400 mg       See Hyperspace for full Linked Orders Report.   400 mg 200 mL/hr over 60 Minutes Intravenous  Once 05/25/21 1723 05/25/21 2217   05/25/21 1730  metroNIDAZOLE (FLAGYL) IVPB 500 mg       See Hyperspace for full Linked Orders Report.   500 mg 100 mL/hr over 60 Minutes Intravenous  Once 05/25/21 1723 05/25/21 2048        Assessment/Plan Possible acalculous cholecystitis  Transaminitis with hyperbilirubinemia  -  RUQ [place] 5/25: no gallstones, gallbladder wall thickening, pericholecystic fluid, positive sonographic Murphy sign - CT 5/25: HSM, gallbladder wall thickening, possible pelvic congestion syndrome - GI consulted as well and MRCP ordered this AM - Tbili 3.9 from 5.6, AST/ALT 170/279 from 296/376, Alk Phos 403 from 500 - patient with significant pain this AM - if no signs of CBD stone or other hepatic pathology on MRCP, potentially plan for lap chole   FEN: NPO VTE: ok to have SQH or LMWH from a surgical standpoint ID: cipro/flagyl 5/25>>  LOS: 0 days     Norm Parcel, [facility] 05/26/2021, 9:02 AM Please see Amion for pager number during day hours 7:00am-4:30pm

## 2021-05-27 DIAGNOSIS — K819 Cholecystitis, unspecified: Secondary | ICD-10-CM | POA: Diagnosis not present

## 2021-05-27 DIAGNOSIS — R7401 Elevation of levels of liver transaminase levels: Secondary | ICD-10-CM | POA: Diagnosis not present

## 2021-05-27 LAB — BASIC METABOLIC PANEL
Anion gap: 13 (ref 5–15)
BUN: 7 mg/dL (ref 6–20)
CO2: 22 mmol/L (ref 22–32)
Calcium: 9.3 mg/dL (ref 8.9–10.3)
Chloride: 101 mmol/L (ref 98–111)
Creatinine, Ser: 0.83 mg/dL (ref 0.44–1.00)
GFR, Estimated: >60 mL/min (ref >60)
Glucose, Bld: 76 mg/dL (ref 70–99)
Potassium: 4.3 mmol/L (ref 3.5–5.1)
Sodium: 136 mmol/L (ref 135–145)

## 2021-05-27 LAB — CBC
HCT: 39.7 % (ref 36.0–46.0)
Hemoglobin: 13.3 g/dL (ref 12.0–15.0)
MCH: 29.9 pg (ref 26.0–34.0)
MCHC: 33.5 g/dL (ref 30.0–36.0)
MCV: 89.2 fL (ref 80.0–100.0)
Platelets: 155 10*3/uL (ref 150–400)
RBC: 4.45 MIL/uL (ref 3.87–5.11)
RDW: 13.1 % (ref 11.5–15.5)
WBC: 5.7 10*3/uL (ref 4.0–10.5)
nRBC: 0 % (ref 0.0–0.2)

## 2021-05-27 LAB — HEPATIC FUNCTION PANEL
ALT: 221 U/L — ABNORMAL HIGH (ref 0–44)
AST: 120 U/L — ABNORMAL HIGH (ref 15–41)
Albumin: 3.1 g/dL — ABNORMAL LOW (ref 3.5–5.0)
Alkaline Phosphatase: 396 U/L — ABNORMAL HIGH (ref 38–126)
Bilirubin, Direct: 1.6 mg/dL — ABNORMAL HIGH (ref 0.0–0.2)
Indirect Bilirubin: 1.8 mg/dL — ABNORMAL HIGH (ref 0.3–0.9)
Total Bilirubin: 3.4 mg/dL — ABNORMAL HIGH (ref 0.3–1.2)
Total Protein: 6.2 g/dL — ABNORMAL LOW (ref 6.5–8.1)

## 2021-05-27 LAB — ANA W/REFLEX IF POSITIVE: Anti Nuclear Antibody (ANA): NEGATIVE

## 2021-05-27 LAB — IGG: IgG (Immunoglobin G), Serum: 801 mg/dL (ref 586–1602)

## 2021-05-27 LAB — MAGNESIUM: Magnesium: 1.7 mg/dL (ref 1.7–2.4)

## 2021-05-27 LAB — GLUCOSE, CAPILLARY: Glucose-Capillary: 141 mg/dL — ABNORMAL HIGH (ref 70–99)

## 2021-05-27 NOTE — Progress Notes (Cosign Needed Addendum)
Progress Note     Subjective: States she is about the same -- constant abdominal pain, worse on the right side, intermittent exacerbations. Has tolerated water since admission but reports nausea and occasional vomiting at home, not necessarily related to meals. Urine still dark.   Objective: Vital signs in last 24 hours: Temp:  [97.3 F (36.3 C)-98.5 F (36.9 C)] 98.5 F (36.9 C) (05/27 0710) Pulse Rate:  [82-98] 87 (05/27 0710) Resp:  [16-18] 16 (05/27 0710) BP: (98-121)/(56-62) 98/60 (05/27 0710) SpO2:  [98 %-99 %] 99 % (05/27 0710) Last BM Date : 05/25/21  Intake/Output from previous day: 05/26 0701 - 05/27 0700 In: 419 [IV Piggyback:419] Out: -  Intake/Output this shift: No intake/output data recorded.  PE: General: WD, WN female who is laying in bed and in NAD Heart: regular, rate, and rhythm.   Lungs: Respiratory effort nonlabored Abd: soft, ttp in RUQ and RLQ with voluntary guarding, no peritonitis     Lab Results:  Recent Labs    05/26/21 0401 05/27/21 0400  WBC 6.8 5.7  HGB 12.9 13.3  HCT 38.3 39.7  PLT 142* 155   BMET Recent Labs    05/26/21 0401 05/27/21 0400  NA 137 136  K 4.4 4.3  CL 106 101  CO2 24 22  GLUCOSE 99 76  BUN <5* 7  CREATININE 0.65 0.83  CALCIUM 8.9 9.3   PT/INR Recent Labs    05/25/21 2002 05/26/21 0401  LABPROT 12.8 12.3  INR 1.0 0.9   CMP     Component Value Date/Time   NA 136 05/27/2021 0400   K 4.3 05/27/2021 0400   CL 101 05/27/2021 0400   CO2 22 05/27/2021 0400   GLUCOSE 76 05/27/2021 0400   BUN 7 05/27/2021 0400   CREATININE 0.83 05/27/2021 0400   CALCIUM 9.3 05/27/2021 0400   PROT 6.2 (L) 05/27/2021 0400   ALBUMIN 3.1 (L) 05/27/2021 0400   AST 120 (H) 05/27/2021 0400   ALT 221 (H) 05/27/2021 0400   ALKPHOS 396 (H) 05/27/2021 0400   BILITOT 3.4 (H) 05/27/2021 0400   GFRNONAA >60 05/27/2021 0400   Lipase     Component Value Date/Time   LIPASE 31 05/25/2021 1150       Studies/Results: CT  ABDOMEN PELVIS W CONTRAST  Result Date: 05/25/2021 CLINICAL DATA:  Transaminitis, abdominal pain EXAM: CT ABDOMEN AND PELVIS WITH CONTRAST TECHNIQUE: Multidetector CT imaging of the abdomen and pelvis was performed using the standard protocol following bolus administration of intravenous contrast. RADIATION DOSE REDUCTION: This exam was performed according to the departmental dose-optimization program which includes automated exposure control, adjustment of the mA and/or kV according to patient size and/or use of iterative reconstruction technique. CONTRAST:  27m OMNIPAQUE IOHEXOL 300 MG/ML  SOLN COMPARISON:  None Available. FINDINGS: Lower chest: No acute abnormality. Hepatobiliary: Liver is enlarged measuring 18.4 cm in length. No focal hepatic mass identified. Gallbladder is normal size and appears to contain mild increased densities, possibly cholelithiasis or sludge. There is diffuse gallbladder wall thickening. No biliary ductal dilatation. Pancreas: Unremarkable. No pancreatic ductal dilatation or surrounding inflammatory changes. Spleen: Enlarged measuring 13.5 cm in length. Adrenals/Urinary Tract: Adrenal glands are unremarkable. Kidneys are normal, without renal calculi, focal lesion, or hydronephrosis. Bladder is unremarkable. Stomach/Bowel: No bowel obstruction, free air or pneumatosis. No bowel wall edema identified. Moderate amount of retained fecal material in the colon. Appendix is normal. Vascular/Lymphatic: No significant vascular findings are present. No enlarged abdominal or pelvic lymph nodes. Reproductive:  Diffuse prominent myometrial vascularity as well as prominent left adnexal vascularity and ovarian vein which measures up to 7 mm in diameter. No suspicious adnexal mass identified. Other: No ascites. Musculoskeletal: No acute or significant osseous findings. IMPRESSION: 1. Hepatosplenomegaly.  No biliary ductal dilatation. 2. Gallbladder wall thickening and possible cholelithiasis or  sludge. Consider follow-up right upper quadrant ultrasound. 3. Prominent myometrial and left adnexal vascularity which is nonspecific and can be seen with pelvic congestion syndrome, correlate clinically. Electronically Signed   By: Ofilia Neas M.D.   On: 05/25/2021 15:15   MR 3D Recon At Scanner  Result Date: 05/26/2021 CLINICAL DATA:  One week history of right upper quadrant pain and intermittent nausea and vomiting. Acute cholecystitis and obstructive jaundice. EXAM: MRI ABDOMEN WITHOUT AND WITH CONTRAST (INCLUDING MRCP) TECHNIQUE: Multiplanar multisequence MR imaging of the abdomen was performed both before and after the administration of intravenous contrast. Heavily T2-weighted images of the biliary and pancreatic ducts were obtained, and three-dimensional MRCP images were rendered by post processing. CONTRAST:  4.57m GADAVIST GADOBUTROL 1 MMOL/ML IV SOLN COMPARISON:  Right upper quadrant ultrasound and CT abdomen pelvis dated May 25, 2021 FINDINGS: Lower chest: Heterogeneous signal in the lung bases likely reflects atelectasis. Hepatobiliary: No significant hepatic steatosis. No suspicious hepatic lesion. Hepatomegaly measuring 19.9 cm in maximum craniocaudal dimension at the midclavicular line. Periportal edema. Distended gallbladder with some pericholecystic fluid. No biliary ductal dilation or overt peribiliary enhancement. Pancreas: Intrinsic T1 signal of the pancreatic parenchyma is within normal limits. No pancreatic ductal dilation. Homogeneous postcontrast enhancement of the pancreas. Spleen:  Splenomegaly measuring 14.6 cm in craniocaudal dimension. Adrenals/Urinary Tract: Bilateral adrenal glands appear normal. No hydronephrosis. No suspicious renal mass. Stomach/Bowel: Visualized portions within the abdomen are unremarkable. Vascular/Lymphatic: No pathologically enlarged lymph nodes identified. No abdominal aortic aneurysm demonstrated. Other:  Trace perihepatic free fluid.  Musculoskeletal: No suspicious bone lesions identified. IMPRESSION: 1. Hepatomegaly with periportal edema and trace perihepatic fluid, possibly reflecting hepatitis. 2. Distended gallbladder with some pericholecystic fluid, favored to reflect sequela of a hepatocellular process however acute cholecystitis is not excluded. Consider more definitive assessment for acute cholecystitis with nuclear medicine HIDA scan. 3. No biliary ductal dilation or choledocholithiasis. 4. Splenomegaly. Electronically Signed   By: JDahlia BailiffM.D.   On: 05/26/2021 11:03   [place]dom[place] Limited  Result Date: 05/25/2021 CLINICAL DATA:  Transaminitis. EXAM: ULTRASOUND ABDOMEN LIMITED RIGHT UPPER QUADRANT COMPARISON:  CT abdomen and pelvis 05/25/2021. FINDINGS: Gallbladder: No gallstones are identified. There is gallbladder wall thickening measuring up to 4 mm with mild pericholecystic fluid. Sonographic MPercell Millersign is positive per sonographer. Common bile duct: Diameter: 1.9 mm. Liver: No focal lesion identified. Within normal limits in parenchymal echogenicity. Portal vein is patent on color Doppler imaging with normal direction of blood flow towards the liver. Other: None. IMPRESSION: 1. No gallstones. Gallbladder wall thickening, pericholecystic fluid and positive sonographic Murphy sign. Findings are concerning for acute acalculous cholecystitis. Electronically Signed   By: ARonney AstersM.D.   On: 05/25/2021 17:10   MR ABDOMEN MRCP W WO CONTAST  Result Date: 05/26/2021 CLINICAL DATA:  One week history of right upper quadrant pain and intermittent nausea and vomiting. Acute cholecystitis and obstructive jaundice. EXAM: MRI ABDOMEN WITHOUT AND WITH CONTRAST (INCLUDING MRCP) TECHNIQUE: Multiplanar multisequence MR imaging of the abdomen was performed both before and after the administration of intravenous contrast. Heavily T2-weighted images of the biliary and pancreatic ducts were obtained, and three-dimensional MRCP images were  rendered by post processing.  CONTRAST:  4.91m GADAVIST GADOBUTROL 1 MMOL/ML IV SOLN COMPARISON:  Right upper quadrant ultrasound and CT abdomen pelvis dated May 25, 2021 FINDINGS: Lower chest: Heterogeneous signal in the lung bases likely reflects atelectasis. Hepatobiliary: No significant hepatic steatosis. No suspicious hepatic lesion. Hepatomegaly measuring 19.9 cm in maximum craniocaudal dimension at the midclavicular line. Periportal edema. Distended gallbladder with some pericholecystic fluid. No biliary ductal dilation or overt peribiliary enhancement. Pancreas: Intrinsic T1 signal of the pancreatic parenchyma is within normal limits. No pancreatic ductal dilation. Homogeneous postcontrast enhancement of the pancreas. Spleen:  Splenomegaly measuring 14.6 cm in craniocaudal dimension. Adrenals/Urinary Tract: Bilateral adrenal glands appear normal. No hydronephrosis. No suspicious renal mass. Stomach/Bowel: Visualized portions within the abdomen are unremarkable. Vascular/Lymphatic: No pathologically enlarged lymph nodes identified. No abdominal aortic aneurysm demonstrated. Other:  Trace perihepatic free fluid. Musculoskeletal: No suspicious bone lesions identified. IMPRESSION: 1. Hepatomegaly with periportal edema and trace perihepatic fluid, possibly reflecting hepatitis. 2. Distended gallbladder with some pericholecystic fluid, favored to reflect sequela of a hepatocellular process however acute cholecystitis is not excluded. Consider more definitive assessment for acute cholecystitis with nuclear medicine HIDA scan. 3. No biliary ductal dilation or choledocholithiasis. 4. Splenomegaly. Electronically Signed   By: JDahlia BailiffM.D.   On: 05/26/2021 11:03    Anti-infectives: Anti-infectives (From admission, onward)    Start     Dose/Rate Route Frequency Ordered Stop   05/26/21 0600  metroNIDAZOLE (FLAGYL) IVPB 500 mg        500 mg 100 mL/hr over 60 Minutes Intravenous Every 12 hours 05/25/21 1902      05/26/21 0600  ciprofloxacin (CIPRO) IVPB 400 mg        400 mg 200 mL/hr over 60 Minutes Intravenous Every 12 hours 05/25/21 1903     05/25/21 1730  ciprofloxacin (CIPRO) IVPB 400 mg       See Hyperspace for full Linked Orders Report.   400 mg 200 mL/hr over 60 Minutes Intravenous  Once 05/25/21 1723 05/25/21 2217   05/25/21 1730  metroNIDAZOLE (FLAGYL) IVPB 500 mg       See Hyperspace for full Linked Orders Report.   500 mg 100 mL/hr over 60 Minutes Intravenous  Once 05/25/21 1723 05/25/21 2048        Assessment/Plan Possible acalculous cholecystitis  Transaminitis with hyperbilirubinemia  - RUQ [place]5/25: no gallstones, gallbladder wall thickening, pericholecystic fluid, positive sonographic Murphy sign - CT 5/25: HSM, gallbladder wall thickening, possible pelvic congestion syndrome - GI consulted as well and MRCP ordered this AM - Tbili 3.9 from 5.6, AST/ALT 170/279 from 296/376, Alk Phos 403 from 500 - patient with significant pain this AM - MRCP w/o choledocholithiasis, shows periportal edema and small amt perihepatic fluid, distended gallbladder with some pericholecystic fluid favored to be reactive to hepatocellular process, splenomegaly.  - acalculous cholecystitis in a otherwise healthy 21y/o F is very unlikely. Acalculous cholecystitis would not explain the hepatosplenomegaly or elevated LFTs. Suspect the gallbladder abnormalities noted on imaging are all reactive to a hepatocellular process. I do not recommend lap chole at this time, will review with my attending physician.   FEN: NPO VTE: ok to have SQH or LMWH from a surgical standpoint ID: cipro/flagyl 5/25>>  LOS: 1 day     EJill Alexanders PAmerican Surg[facility] CentersSur[facility] 05/27/2021, 8:09 AM Please see Amion for pager number during day hours 7:00am-4:30pm

## 2021-05-27 NOTE — Progress Notes (Signed)
PROGRESS NOTE        PATIENT DETAILS Name: Bethany Rose Age: 21 y.o. Sex: female Date of Birth: 2000-08-15 Admit Date: 05/25/2021 Admitting Physician Evalee Mutton Kristeen Mans, MD PCP:Pa, Murray  Brief Summary: Patient is a 21 y.o.  female with no past medical history-presenting with 1 week history of mostly RUQ pain and intermittent nausea/vomiting.  She was found to have acute cholecystitis and obstructive jaundice.  She was subsequently admitted to [facility] for [facility] evaluation and treatment.   Significant events: 5/25>> admit to [facility]-acute cholecystitis with obstructive jaundice.  Significant studies: 5/25>> CT abdomen/pelvis: Hepatosplenomegaly-no biliary ductal dilatation.  Gallbladder wall thickening with possible cholelithiasis/sludge. 5/25>> RUQ ultrasound: Gallbladder thickening with pericholecystic fluid and positive sonographic Murphy sign.  Significant microbiology data: 5/25>>  COVID PCR: Negative 5/25>> blood culture: No growth 5/25>> hepatitis A/B/C serology: Negative  Procedures: None  Consults: GI, general surgery  Subjective: Continues to have right-sided pain-thinks pain is slightly better than yesterday.  Objective: Vitals: Blood pressure 98/60, pulse 87, temperature 98.5 F (36.9 C), temperature source Oral, resp. rate 16, height 5' (1.524 m), weight 49.9 kg, last menstrual period 05/20/2021, SpO2 99 %.   Exam: Gen Exam:Alert awake-not in any distress HEENT:atraumatic, normocephalic Chest: B/L clear to auscultation anteriorly CVS:S1S2 regular Abdomen: Right-sided-mostly upper abdominal tenderness persists.  Otherwise abdomen is soft. Extremities:no edema Neurology: Non focal Skin: no rash   Pertinent Labs/Radiology:    Latest Ref Rng & Units 05/27/2021    4:00 AM 05/26/2021    4:01 AM 05/25/2021    8:02 PM  CBC  WBC 4.0 - 10.5 K/uL 5.7   6.8   7.7    Hemoglobin 12.0 - 15.0 g/dL 13.3   12.9   12.8     Hematocrit 36.0 - 46.0 % 39.7   38.3   39.9    Platelets 150 - 400 K/uL 155   142   137      Lab Results  Component Value Date   NA 136 05/27/2021   K 4.3 05/27/2021   CL 101 05/27/2021   CO2 22 05/27/2021     Assessment/Plan: RUQ pain: Unclear whether this is acalculous cholecystitis or a autoimmune process causing hepatitis/splenomegaly.  LFTs thankfully trending down-she is still complaining of some pain but frequency of pain has started to decrease.  Awaiting serological work-up-appreciate GI/surgical input.  BMI: Estimated body mass index is 21.48 kg/m as calculated from the following:   Height as of this encounter: 5' (1.524 m).   Weight as of this encounter: 49.9 kg.   Code status:   Code Status: Full Code   DVT Prophylaxis: SCDs Start: 05/25/21 2050   Family Communication: Mother at bedside   Disposition Plan: Status is: Observation The patient will require care spanning > 2 midnights and should be moved to inpatient because: Cholecystitis-on IV antibiotics-awaiting GI evaluation/MRCP-likely will require laparoscopic cholecystectomy prior to discharge.  Not stable for discharge-likely requires another 1-2 days of hospitalization.   Planned Discharge Destination:Home   Diet: Diet Order             Diet clear liquid Room service appropriate? Yes; Fluid consistency: Thin  Diet effective now                     Antimicrobial agents: Anti-infectives (From admission, onward)    Start  Dose/Rate Route Frequency Ordered Stop   05/26/21 0600  metroNIDAZOLE (FLAGYL) IVPB 500 mg        500 mg 100 mL/hr over 60 Minutes Intravenous Every 12 hours 05/25/21 1902     05/26/21 0600  ciprofloxacin (CIPRO) IVPB 400 mg        400 mg 200 mL/hr over 60 Minutes Intravenous Every 12 hours 05/25/21 1903     05/25/21 1730  ciprofloxacin (CIPRO) IVPB 400 mg       See Hyperspace for full Linked Orders Report.   400 mg 200 mL/hr over 60 Minutes Intravenous  Once  05/25/21 1723 05/25/21 2217   05/25/21 1730  metroNIDAZOLE (FLAGYL) IVPB 500 mg       See Hyperspace for full Linked Orders Report.   500 mg 100 mL/hr over 60 Minutes Intravenous  Once 05/25/21 1723 05/25/21 2048        MEDICATIONS: Scheduled Meds: Continuous Infusions:  ciprofloxacin Stopped (05/27/21 0745)   metronidazole 500 mg (05/27/21 0525)   PRN Meds:.HYDROmorphone (DILAUDID) injection, naLOXone (NARCAN)  injection, ondansetron **OR** ondansetron (ZOFRAN) IV   I have personally reviewed following labs and imaging studies  LABORATORY DATA: CBC: Recent Labs  Lab 05/25/21 1150 05/25/21 2002 05/26/21 0401 05/27/21 0400  WBC 9.1 7.7 6.8 5.7  NEUTROABS  --  2.6  --   --   HGB 14.7 12.8 12.9 13.3  HCT 45.5 39.9 38.3 39.7  MCV 91.7 93.0 89.3 89.2  PLT 142* 137* 142* 155     Basic Metabolic Panel: Recent Labs  Lab 05/25/21 1150 05/25/21 2002 05/26/21 0401 05/27/21 0400  NA 138  --  137 136  K 3.8  --  4.4 4.3  CL 106  --  106 101  CO2 22  --  24 22  GLUCOSE 128*  --  99 76  BUN 6  --  <5* 7  CREATININE 0.66  --  0.65 0.83  CALCIUM 9.1  --  8.9 9.3  MG  --  1.6* 1.9 1.7  PHOS  --  3.9 4.2  --      GFR: Estimated Creatinine Clearance: 77.7 mL/min (by C-G formula based on SCr of 0.83 mg/dL).  Liver Function Tests: Recent Labs  Lab 05/25/21 1150 05/26/21 0401 05/27/21 0400  AST 296* 170* 120*  ALT 376* 279* 221*  ALKPHOS 500* 403* 396*  BILITOT 5.6* 3.9* 3.4*  PROT 6.8 5.9* 6.2*  ALBUMIN 3.4* 3.0* 3.1*    Recent Labs  Lab 05/25/21 1150  LIPASE 31    Recent Labs  Lab 05/25/21 2002  AMMONIA 30     Coagulation Profile: Recent Labs  Lab 05/25/21 2002 05/26/21 0401  INR 1.0 0.9     Cardiac Enzymes: Recent Labs  Lab 05/25/21 2002  CKTOTAL 24*     BNP (last 3 results) No results for input(s): PROBNP in the last 8760 hours.  Lipid Profile: No results for input(s): CHOL, HDL, LDLCALC, TRIG, CHOLHDL, LDLDIRECT in the last 72  hours.  Thyroid Function Tests: Recent Labs    05/25/21 2002  TSH 1.948     Anemia Panel: No results for input(s): VITAMINB12, FOLATE, FERRITIN, TIBC, IRON, RETICCTPCT in the last 72 hours.  Urine analysis:    Component Value Date/Time   COLORURINE AMBER (A) 05/25/2021 1143   APPEARANCEUR CLEAR 05/25/2021 1143   LABSPEC 1.015 05/25/2021 1143   PHURINE 5.0 05/25/2021 1143   GLUCOSEU NEGATIVE 05/25/2021 1143   HGBUR NEGATIVE 05/25/2021 1143   BILIRUBINUR SMALL (A)  05/25/2021 1143   [place] 05/25/2021 1143   PROTEINUR NEGATIVE 05/25/2021 1143   NITRITE NEGATIVE 05/25/2021 1143   LEUKOCYTESUR NEGATIVE 05/25/2021 1143    Sepsis Labs: Lactic Acid, Venous No results found for: LATICACIDVEN  MICROBIOLOGY: Recent Results (from the past 240 hour(s))  Blood culture (routine x 2)     Status: None (Preliminary result)   Collection Time: 05/25/21  5:24 PM   Specimen: BLOOD LEFT ARM  Result Value Ref Range Status   Specimen Description BLOOD LEFT ARM  Final   Special Requests   Final    BOTTLES DRAWN AEROBIC AND ANAEROBIC Blood Culture results may not be optimal due to an inadequate volume of blood received in culture bottles   Culture   Final    NO GROWTH 2 DAYS Performed at [facility], [place] [address], [place], [place] [address]    Report Status PENDING  Incomplete  Blood culture (routine x 2)     Status: None (Preliminary result)   Collection Time: 05/25/21  5:44 PM   Specimen: BLOOD RIGHT HAND  Result Value Ref Range Status   Specimen Description BLOOD RIGHT HAND  Final   Special Requests   Final    BOTTLES DRAWN AEROBIC AND ANAEROBIC Blood Culture adequate volume   Culture   Final    NO GROWTH 2 DAYS Performed at [facility], [place] [address] [place], [place] [address]    Report Status PENDING  Incomplete  SARS Coronavirus 2 by RT PCR (hospital order, performed in [facility] hospital lab) *cepheid single result test* Anterior Nasal Swab      Status: None   Collection Time: 05/25/21  6:46 PM   Specimen: Anterior Nasal Swab  Result Value Ref Range Status   SARS Coronavirus 2 by RT PCR NEGATIVE NEGATIVE Final    Comment: (NOTE) SARS-CoV-2 target nucleic acids are NOT DETECTED.  The SARS-CoV-2 RNA is generally detectable in upper and lower respiratory specimens during the acute phase of infection. The lowest concentration of SARS-CoV-2 viral copies this assay can detect is 250 copies / mL. A negative result does not preclude SARS-CoV-2 infection and should not be used as the sole basis for treatment or other patient management decisions.  A negative result may occur with improper specimen collection / handling, submission of specimen other than nasopharyngeal swab, presence of viral mutation(s) within the areas targeted by this assay, and inadequate number of viral copies (<250 copies / mL). A negative result must be combined with clinical observations, patient history, and epidemiological information.  Fact Sheet for Patients:   https://www.patel.info/  Fact Sheet for Healthcare Providers: https://hall.com/  This test is not yet approved or  cleared by the [place] FDA and has been authorized for detection and/or diagnosis of SARS-CoV-2 by FDA under an Emergency Use Authorization (EUA).  This EUA will remain in effect (meaning this test can be used) for the duration of the COVID-19 declaration under Section 564(b)(1) of the Act, 21 U.S.C. section 360bbb-3(b)(1), unless the authorization is terminated or revoked sooner.  Performed at [facility], [place] [address], [place], [place] [address]   Respiratory (~20 pathogens) panel by PCR     Status: None   Collection Time: 05/25/21  7:16 PM   Specimen: Nasopharyngeal Swab; Respiratory  Result Value Ref Range Status   Adenovirus NOT DETECTED NOT DETECTED Final   Coronavirus 229E NOT DETECTED NOT DETECTED Final     Comment: (NOTE) The Coronavirus on the  Respiratory Panel, DOES NOT test for the novel  Coronavirus (2019 nCoV)    Coronavirus HKU1 NOT DETECTED NOT DETECTED Final   Coronavirus NL63 NOT DETECTED NOT DETECTED Final   Coronavirus OC43 NOT DETECTED NOT DETECTED Final   Metapneumovirus NOT DETECTED NOT DETECTED Final   Rhinovirus / Enterovirus NOT DETECTED NOT DETECTED Final   Influenza A NOT DETECTED NOT DETECTED Final   Influenza B NOT DETECTED NOT DETECTED Final   Parainfluenza Virus 1 NOT DETECTED NOT DETECTED Final   Parainfluenza Virus 2 NOT DETECTED NOT DETECTED Final   Parainfluenza Virus 3 NOT DETECTED NOT DETECTED Final   Parainfluenza Virus 4 NOT DETECTED NOT DETECTED Final   Respiratory Syncytial Virus NOT DETECTED NOT DETECTED Final   Bordetella pertussis NOT DETECTED NOT DETECTED Final   Bordetella Parapertussis NOT DETECTED NOT DETECTED Final   Chlamydophila pneumoniae NOT DETECTED NOT DETECTED Final   Mycoplasma pneumoniae NOT DETECTED NOT DETECTED Final    Comment: Performed at [facility], [place] [address], [place], [place] [address]    RADIOLOGY STUDIES/RESULTS: CT ABDOMEN PELVIS W CONTRAST  Result Date: 05/25/2021 CLINICAL DATA:  Transaminitis, abdominal pain EXAM: CT ABDOMEN AND PELVIS WITH CONTRAST TECHNIQUE: Multidetector CT imaging of the abdomen and pelvis was performed using the standard protocol following bolus administration of intravenous contrast. RADIATION DOSE REDUCTION: This exam was performed according to the departmental dose-optimization program which includes automated exposure control, adjustment of the mA and/or kV according to patient size and/or use of iterative reconstruction technique. CONTRAST:  75mL OMNIPAQUE IOHEXOL 300 MG/ML  SOLN COMPARISON:  None Available. FINDINGS: Lower chest: No acute abnormality. Hepatobiliary: Liver is enlarged measuring 18.4 cm in length. No focal hepatic mass identified. Gallbladder is normal size and appears to  contain mild increased densities, possibly cholelithiasis or sludge. There is diffuse gallbladder wall thickening. No biliary ductal dilatation. Pancreas: Unremarkable. No pancreatic ductal dilatation or surrounding inflammatory changes. Spleen: Enlarged measuring 13.5 cm in length. Adrenals/Urinary Tract: Adrenal glands are unremarkable. Kidneys are normal, without renal calculi, focal lesion, or hydronephrosis. Bladder is unremarkable. Stomach/Bowel: No bowel obstruction, free air or pneumatosis. No bowel wall edema identified. Moderate amount of retained fecal material in the colon. Appendix is normal. Vascular/Lymphatic: No significant vascular findings are present. No enlarged abdominal or pelvic lymph nodes. Reproductive: Diffuse prominent myometrial vascularity as well as prominent left adnexal vascularity and ovarian vein which measures up to 7 mm in diameter. No suspicious adnexal mass identified. Other: No ascites. Musculoskeletal: No acute or significant osseous findings. IMPRESSION: 1. Hepatosplenomegaly.  No biliary ductal dilatation. 2. Gallbladder wall thickening and possible cholelithiasis or sludge. Consider follow-up right upper quadrant ultrasound. 3. Prominent myometrial and left adnexal vascularity which is nonspecific and can be seen with pelvic congestion syndrome, correlate clinically. Electronically Signed   By: Ofilia Neas M.D.   On: 05/25/2021 15:15   MR 3D Recon At Scanner  Result Date: 05/26/2021 CLINICAL DATA:  One week history of right upper quadrant pain and intermittent nausea and vomiting. Acute cholecystitis and obstructive jaundice. EXAM: MRI ABDOMEN WITHOUT AND WITH CONTRAST (INCLUDING MRCP) TECHNIQUE: Multiplanar multisequence MR imaging of the abdomen was performed both before and after the administration of intravenous contrast. Heavily T2-weighted images of the biliary and pancreatic ducts were obtained, and three-dimensional MRCP images were rendered by post  processing. CONTRAST:  4.20mL GADAVIST GADOBUTROL 1 MMOL/ML IV SOLN COMPARISON:  Right upper quadrant ultrasound and CT abdomen pelvis dated May 25, 2021 FINDINGS: Lower chest: Heterogeneous signal in  the lung bases likely reflects atelectasis. Hepatobiliary: No significant hepatic steatosis. No suspicious hepatic lesion. Hepatomegaly measuring 19.9 cm in maximum craniocaudal dimension at the midclavicular line. Periportal edema. Distended gallbladder with some pericholecystic fluid. No biliary ductal dilation or overt peribiliary enhancement. Pancreas: Intrinsic T1 signal of the pancreatic parenchyma is within normal limits. No pancreatic ductal dilation. Homogeneous postcontrast enhancement of the pancreas. Spleen:  Splenomegaly measuring 14.6 cm in craniocaudal dimension. Adrenals/Urinary Tract: Bilateral adrenal glands appear normal. No hydronephrosis. No suspicious renal mass. Stomach/Bowel: Visualized portions within the abdomen are unremarkable. Vascular/Lymphatic: No pathologically enlarged lymph nodes identified. No abdominal aortic aneurysm demonstrated. Other:  Trace perihepatic free fluid. Musculoskeletal: No suspicious bone lesions identified. IMPRESSION: 1. Hepatomegaly with periportal edema and trace perihepatic fluid, possibly reflecting hepatitis. 2. Distended gallbladder with some pericholecystic fluid, favored to reflect sequela of a hepatocellular process however acute cholecystitis is not excluded. Consider more definitive assessment for acute cholecystitis with nuclear medicine HIDA scan. 3. No biliary ductal dilation or choledocholithiasis. 4. Splenomegaly. Electronically Signed   By: Dahlia Bailiff M.D.   On: 05/26/2021 11:03   US Abdomen Limited  Result Date: 05/25/2021 CLINICAL DATA:  Transaminitis. EXAM: ULTRASOUND ABDOMEN LIMITED RIGHT UPPER QUADRANT COMPARISON:  CT abdomen and pelvis 05/25/2021. FINDINGS: Gallbladder: No gallstones are identified. There is gallbladder wall thickening  measuring up to 4 mm with mild pericholecystic fluid. Sonographic Percell Miller sign is positive per sonographer. Common bile duct: Diameter: 1.9 mm. Liver: No focal lesion identified. Within normal limits in parenchymal echogenicity. Portal vein is patent on color Doppler imaging with normal direction of blood flow towards the liver. Other: None. IMPRESSION: 1. No gallstones. Gallbladder wall thickening, pericholecystic fluid and positive sonographic Murphy sign. Findings are concerning for acute acalculous cholecystitis. Electronically Signed   By: Ronney Asters M.D.   On: 05/25/2021 17:10   MR ABDOMEN MRCP W WO CONTAST  Result Date: 05/26/2021 CLINICAL DATA:  One week history of right upper quadrant pain and intermittent nausea and vomiting. Acute cholecystitis and obstructive jaundice. EXAM: MRI ABDOMEN WITHOUT AND WITH CONTRAST (INCLUDING MRCP) TECHNIQUE: Multiplanar multisequence MR imaging of the abdomen was performed both before and after the administration of intravenous contrast. Heavily T2-weighted images of the biliary and pancreatic ducts were obtained, and three-dimensional MRCP images were rendered by post processing. CONTRAST:  4.25mL GADAVIST GADOBUTROL 1 MMOL/ML IV SOLN COMPARISON:  Right upper quadrant ultrasound and CT abdomen pelvis dated May 25, 2021 FINDINGS: Lower chest: Heterogeneous signal in the lung bases likely reflects atelectasis. Hepatobiliary: No significant hepatic steatosis. No suspicious hepatic lesion. Hepatomegaly measuring 19.9 cm in maximum craniocaudal dimension at the midclavicular line. Periportal edema. Distended gallbladder with some pericholecystic fluid. No biliary ductal dilation or overt peribiliary enhancement. Pancreas: Intrinsic T1 signal of the pancreatic parenchyma is within normal limits. No pancreatic ductal dilation. Homogeneous postcontrast enhancement of the pancreas. Spleen:  Splenomegaly measuring 14.6 cm in craniocaudal dimension. Adrenals/Urinary Tract:  Bilateral adrenal glands appear normal. No hydronephrosis. No suspicious renal mass. Stomach/Bowel: Visualized portions within the abdomen are unremarkable. Vascular/Lymphatic: No pathologically enlarged lymph nodes identified. No abdominal aortic aneurysm demonstrated. Other:  Trace perihepatic free fluid. Musculoskeletal: No suspicious bone lesions identified. IMPRESSION: 1. Hepatomegaly with periportal edema and trace perihepatic fluid, possibly reflecting hepatitis. 2. Distended gallbladder with some pericholecystic fluid, favored to reflect sequela of a hepatocellular process however acute cholecystitis is not excluded. Consider more definitive assessment for acute cholecystitis with nuclear medicine HIDA scan. 3. No biliary ductal dilation or choledocholithiasis. 4. Splenomegaly. Electronically  Signed   By: Dahlia Bailiff M.D.   On: 05/26/2021 11:03     LOS: 1 day   Oren Binet, MD  [facility]    To contact the attending provider between 7A-7P or the covering provider during after hours 7P-7A, please log into the web site [email or web address] and access using universal [place] password for that web site. If you do not have the password, please call the hospital operator.  05/27/2021, 11:18 AM

## 2021-05-27 NOTE — Progress Notes (Signed)
Bethany Rose 11:06 AM  Subjective: Patient seen and examined and discussed with her father and discussed yesterday with the hospital team and she might be a little better from a pain standpoint and she is tolerating sips of water and has no new complaints  Objective: Vital signs stable afebrile no acute distress still some tenderness without rebound worse in the right upper quadrant White count okay liver test slight decrease  Assessment: Abdominal pain abnormal liver tests questionable etiology I do not know of any hepatitis that hurts that is significantly so I still think the gallbladder may be related  Plan: Follow labs and exams when bili decreases further if still hurting would proceed with HIDA scan and will check on tomorrow and I answered all of her and her father's questions and we discussed a possible liver biopsy which could be done at the time of cholecystectomy or laparoscope or later next week if condition does not continue to improve but again do not think intrinsic liver disease would explain this degree of pain  Jayion Schneck E  office [phone] After 5PM or if no answer call [phone]

## 2021-05-28 DIAGNOSIS — K819 Cholecystitis, unspecified: Secondary | ICD-10-CM | POA: Diagnosis not present

## 2021-05-28 DIAGNOSIS — R7401 Elevation of levels of liver transaminase levels: Secondary | ICD-10-CM | POA: Diagnosis not present

## 2021-05-28 LAB — CBC WITH DIFFERENTIAL/PLATELET
Abs Immature Granulocytes: 0.05 10*3/uL (ref 0.00–0.07)
Basophils Absolute: 0.1 10*3/uL (ref 0.0–0.1)
Basophils Relative: 1 %
Eosinophils Absolute: 0 10*3/uL (ref 0.0–0.5)
Eosinophils Relative: 0 %
HCT: 39.9 % (ref 36.0–46.0)
Hemoglobin: 13.4 g/dL (ref 12.0–15.0)
Immature Granulocytes: 1 %
Lymphocytes Relative: 60 %
Lymphs Abs: 3.1 10*3/uL (ref 0.7–4.0)
MCH: 30.1 pg (ref 26.0–34.0)
MCHC: 33.6 g/dL (ref 30.0–36.0)
MCV: 89.7 fL (ref 80.0–100.0)
Monocytes Absolute: 0.5 10*3/uL (ref 0.1–1.0)
Monocytes Relative: 9 %
Neutro Abs: 1.5 10*3/uL — ABNORMAL LOW (ref 1.7–7.7)
Neutrophils Relative %: 29 %
Platelets: 158 10*3/uL (ref 150–400)
RBC: 4.45 MIL/uL (ref 3.87–5.11)
RDW: 13.2 % (ref 11.5–15.5)
WBC: 5.2 10*3/uL (ref 4.0–10.5)
nRBC: 0 % (ref 0.0–0.2)

## 2021-05-28 LAB — COMPREHENSIVE METABOLIC PANEL
ALT: 182 U/L — ABNORMAL HIGH (ref 0–44)
AST: 92 U/L — ABNORMAL HIGH (ref 15–41)
Albumin: 3.2 g/dL — ABNORMAL LOW (ref 3.5–5.0)
Alkaline Phosphatase: 423 U/L — ABNORMAL HIGH (ref 38–126)
Anion gap: 9 (ref 5–15)
BUN: 5 mg/dL — ABNORMAL LOW (ref 6–20)
CO2: 24 mmol/L (ref 22–32)
Calcium: 9.2 mg/dL (ref 8.9–10.3)
Chloride: 104 mmol/L (ref 98–111)
Creatinine, Ser: 0.77 mg/dL (ref 0.44–1.00)
GFR, Estimated: >60 mL/min (ref >60)
Glucose, Bld: 90 mg/dL (ref 70–99)
Potassium: 4 mmol/L (ref 3.5–5.1)
Sodium: 137 mmol/L (ref 135–145)
Total Bilirubin: 2.9 mg/dL — ABNORMAL HIGH (ref 0.3–1.2)
Total Protein: 6.2 g/dL — ABNORMAL LOW (ref 6.5–8.1)

## 2021-05-28 MED ORDER — ENOXAPARIN SODIUM 40 MG/0.4ML IJ SOSY
40.0000 mg | PREFILLED_SYRINGE | INTRAMUSCULAR | Status: DC
  Filled 2021-05-28 (×2): qty 0.4

## 2021-05-28 NOTE — Progress Notes (Signed)
PROGRESS NOTE        PATIENT DETAILS Name: Bethany Rose Age: 21 y.o. Sex: female Date of Birth: [birth year] Admit Date: 05/25/2021 Admitting Physician Dewayne Shorter Levora Dredge, MD PCP:[facility]  Brief Summary: Patient is a 21 y.o.  female with no past medical history-presenting with 1 week history of mostly RUQ pain and intermittent nausea/vomiting.  She was found to have acute cholecystitis and obstructive jaundice.  She was subsequently admitted to [facility] for further evaluation and treatment.   Significant events: 5/25>> admit to [facility]-acute cholecystitis with obstructive jaundice.  Significant studies: 5/25>> CT abdomen/pelvis: Hepatosplenomegaly-no biliary ductal dilatation.  Gallbladder wall thickening with possible cholelithiasis/sludge. 5/25>> RUQ ultrasound: Gallbladder thickening with pericholecystic fluid and positive sonographic Murphy sign.  Significant microbiology data: 5/25>>  COVID PCR: Negative 5/25>> blood culture: No growth 5/25>> hepatitis A/B/C serology: Negative  Procedures: None  Consults: GI, general surgery  Subjective: Patient somewhat better today.  Tolerating clear liquids.  Objective: Vitals: Blood pressure 107/67, pulse 79, temperature 98.2 F (36.8 C), temperature source Oral, resp. rate 18, height 5' (1.524 m), weight 49.9 kg, last menstrual period 05/20/2021, SpO2 96 %.   Exam: Gen Exam:Alert awake-not in any distress HEENT:atraumatic, normocephalic Chest: B/L clear Abdomen: Soft-still with some RUQ tenderness. Extremities:no edema Neurology: Non focal Skin: no rash    Pertinent Labs/Radiology:    Latest Ref Rng & Units 05/28/2021    1:19 AM 05/27/2021    4:00 AM 05/26/2021    4:01 AM  CBC  WBC 4.0 - 10.5 K/uL 5.2   5.7   6.8    Hemoglobin 12.0 - 15.0 g/dL 72.5   36.6   44.0    Hematocrit 36.0 - 46.0 % 39.9   39.7   38.3    Platelets 150 - 400 K/uL 158   155   142      Lab Results   Component Value Date   NA 137 05/28/2021   K 4.0 05/28/2021   CL 104 05/28/2021   CO2 24 05/28/2021     Assessment/Plan: RUQ pain: Unclear whether this is acalculous cholecystitis or a autoimmune process causing hepatitis/splenomegaly.  Transaminases/bilirubin trending down-but alkaline phosphatase has increased slightly.  Some improvement in RUQ pain today-diet being advanced.  GI planning HIDA scan once bilirubin level is<2.  In the interim-we will continue with Cipro/Flagyl-await further recommendations from GI/[facility] [place] surgery.    BMI: Estimated body mass index is 21.48 kg/m as calculated from the following:   Height as of this encounter: 5' (1.524 m).   Weight as of this encounter: 49.9 kg.   Code status:   Code Status: Full Code   DVT Prophylaxis: SCDs Start: 05/25/21 2050   Family Communication: Mother at bedside   Disposition Plan: Status is: Observation The patient will require care spanning > 2 midnights and should be moved to inpatient because: Cholecystitis-on IV antibiotics-awaiting GI evaluation/MRCP-likely will require laparoscopic cholecystectomy prior to discharge.  Not stable for discharge-likely requires another 1-2 days of hospitalization.   Planned Discharge Destination:Home   Diet: Diet Order             DIET SOFT Room service appropriate? Yes; Fluid consistency: Thin  Diet effective now                     Antimicrobial agents: Anti-infectives (From admission, onward)  Start     Dose/Rate Route Frequency Ordered Stop   05/26/21 0600  metroNIDAZOLE (FLAGYL) IVPB 500 mg        500 mg 100 mL/hr over 60 Minutes Intravenous Every 12 hours 05/25/21 1902     05/26/21 0600  ciprofloxacin (CIPRO) IVPB 400 mg        400 mg 200 mL/hr over 60 Minutes Intravenous Every 12 hours 05/25/21 1903     05/25/21 1730  ciprofloxacin (CIPRO) IVPB 400 mg       See Hyperspace for full Linked Orders Report.   400 mg 200 mL/hr over 60 Minutes  Intravenous  Once 05/25/21 1723 05/25/21 2217   05/25/21 1730  metroNIDAZOLE (FLAGYL) IVPB 500 mg       See Hyperspace for full Linked Orders Report.   500 mg 100 mL/hr over 60 Minutes Intravenous  Once 05/25/21 1723 05/25/21 2048        MEDICATIONS: Scheduled Meds: Continuous Infusions:  ciprofloxacin 400 mg (05/28/21 0949)   metronidazole 500 mg (05/28/21 0648)   PRN Meds:.HYDROmorphone (DILAUDID) injection, naLOXone (NARCAN)  injection, ondansetron **OR** ondansetron (ZOFRAN) IV   I have personally reviewed following labs and imaging studies  LABORATORY DATA: CBC: Recent Labs  Lab 05/25/21 1150 05/25/21 2002 05/26/21 0401 05/27/21 0400 05/28/21 0119  WBC 9.1 7.7 6.8 5.7 5.2  NEUTROABS  --  2.6  --   --  1.5*  HGB 14.7 12.8 12.9 13.3 13.4  HCT 45.5 39.9 38.3 39.7 39.9  MCV 91.7 93.0 89.3 89.2 89.7  PLT 142* 137* 142* 155 158     Basic Metabolic Panel: Recent Labs  Lab 05/25/21 1150 05/25/21 2002 05/26/21 0401 05/27/21 0400 05/28/21 0119  NA 138  --  137 136 137  K 3.8  --  4.4 4.3 4.0  CL 106  --  106 101 104  CO2 22  --  GLUCOSE 128*  --  99 76 90  BUN 6  --  <5* 7 5*  CREATININE 0.66  --  0.65 0.83 0.77  CALCIUM 9.1  --  8.9 9.3 9.2  MG  --  1.6* 1.9 1.7  --   PHOS  --  3.9 4.2  --   --      GFR: Estimated Creatinine Clearance: 80.6 mL/min (by C-G formula based on SCr of 0.77 mg/dL).  Liver Function Tests: Recent Labs  Lab 05/25/21 1150 05/26/21 0401 05/27/21 0400 05/28/21 0119  AST 296* 170* 120* 92*  ALT 376* 279* 221* 182*  ALKPHOS 500* 403* 396* 423*  BILITOT 5.6* 3.9* 3.4* 2.9*  PROT 6.8 5.9* 6.2* 6.2*  ALBUMIN 3.4* 3.0* 3.1* 3.2*    Recent Labs  Lab 05/25/21 1150  LIPASE 31    Recent Labs  Lab 05/25/21 2002  AMMONIA 30     Coagulation Profile: Recent Labs  Lab 05/25/21 2002 05/26/21 0401  INR 1.0 0.9     Cardiac Enzymes: Recent Labs  Lab 05/25/21 2002  CKTOTAL 24*     BNP (last 3  results) No results for input(s): PROBNP in the last 8760 hours.  Lipid Profile: No results for input(s): CHOL, HDL, LDLCALC, TRIG, CHOLHDL, LDLDIRECT in the last 72 hours.  Thyroid Function Tests: Recent Labs    05/25/21 2002  TSH 1.948     Anemia Panel: No results for input(s): VITAMINB12, FOLATE, FERRITIN, TIBC, IRON, RETICCTPCT in the last 72 hours.  Urine analysis:    Component Value Date/Time   COLORURINE AMBER (  A) 05/25/2021 1143   APPEARANCEUR CLEAR 05/25/2021 1143   LABSPEC 1.015 05/25/2021 1143   PHURINE 5.0 05/25/2021 1143   GLUCOSEU NEGATIVE 05/25/2021 1143   HGBUR NEGATIVE 05/25/2021 1143   BILIRUBINUR SMALL (A) 05/25/2021 1143   KETONESUR NEGATIVE 05/25/2021 1143   PROTEINUR NEGATIVE 05/25/2021 1143   NITRITE NEGATIVE 05/25/2021 1143   LEUKOCYTESUR NEGATIVE 05/25/2021 1143    Sepsis Labs: Lactic Acid, Venous No results found for: LATICACIDVEN  MICROBIOLOGY: Recent Results (from the past 240 hour(s))  Blood culture (routine x 2)     Status: None (Preliminary result)   Collection Time: 05/25/21  5:24 PM   Specimen: BLOOD LEFT ARM  Result Value Ref Range Status   Specimen Description BLOOD LEFT ARM  Final   Special Requests   Final    BOTTLES DRAWN AEROBIC AND ANAEROBIC Blood Culture results may not be optimal due to an inadequate volume of blood received in culture bottles   Culture   Final    NO GROWTH 2 DAYS Performed at [facility], [address], [place], [place] [address]    Report Status PENDING  Incomplete  Blood culture (routine x 2)     Status: None (Preliminary result)   Collection Time: 05/25/21  5:44 PM   Specimen: BLOOD RIGHT HAND  Result Value Ref Range Status   Specimen Description BLOOD RIGHT HAND  Final   Special Requests   Final    BOTTLES DRAWN AEROBIC AND ANAEROBIC Blood Culture adequate volume   Culture   Final    NO GROWTH 2 DAYS Performed at [facility], [address], [place], [place] [address]    Report  Status PENDING  Incomplete  SARS Coronavirus 2 by RT PCR (hospital order, performed in [facility] hospital lab) *cepheid single result test* Anterior Nasal Swab     Status: None   Collection Time: 05/25/21  6:46 PM   Specimen: Anterior Nasal Swab  Result Value Ref Range Status   SARS Coronavirus 2 by RT PCR NEGATIVE NEGATIVE Final    Comment: (NOTE) SARS-CoV-2 target nucleic acids are NOT DETECTED.  The SARS-CoV-2 RNA is generally detectable in upper and lower respiratory specimens during the acute phase of infection. The lowest concentration of SARS-CoV-2 viral copies this assay can detect is 250 copies / mL. A negative result does not preclude SARS-CoV-2 infection and should not be used as the sole basis for treatment or other patient management decisions.  A negative result may occur with improper specimen collection / handling, submission of specimen other than nasopharyngeal swab, presence of viral mutation(s) within the areas targeted by this assay, and inadequate number of viral copies (<250 copies / mL). A negative result must be combined with clinical observations, patient history, and epidemiological information.  Fact Sheet for Patients:   RoadLapTop.co.za  Fact Sheet for Healthcare Providers: http://kim-miller.com/  This test is not yet approved or  cleared by the [place] FDA and has been authorized for detection and/or diagnosis of SARS-CoV-2 by FDA under an Emergency Use Authorization (EUA).  This EUA will remain in effect (meaning this test can be used) for the duration of the COVID-19 declaration under Section 564(b)(1) of the Act, 21 U.S.C. section 360bbb-3(b)(1), unless the authorization is terminated or revoked sooner.  Performed at [facility], [address], [place], [place] [address]   Respiratory (~20 pathogens) panel by PCR     Status: None   Collection Time: 05/25/21  7:16 PM  Specimen:  Nasopharyngeal Swab; Respiratory  Result Value Ref Range Status   Adenovirus NOT DETECTED NOT DETECTED Final   Coronavirus 229E NOT DETECTED NOT DETECTED Final    Comment: (NOTE) The Coronavirus on the Respiratory Panel, DOES NOT test for the novel  Coronavirus (2019 nCoV)    Coronavirus HKU1 NOT DETECTED NOT DETECTED Final   Coronavirus NL63 NOT DETECTED NOT DETECTED Final   Coronavirus OC43 NOT DETECTED NOT DETECTED Final   Metapneumovirus NOT DETECTED NOT DETECTED Final   Rhinovirus / Enterovirus NOT DETECTED NOT DETECTED Final   Influenza A NOT DETECTED NOT DETECTED Final   Influenza B NOT DETECTED NOT DETECTED Final   Parainfluenza Virus 1 NOT DETECTED NOT DETECTED Final   Parainfluenza Virus 2 NOT DETECTED NOT DETECTED Final   Parainfluenza Virus 3 NOT DETECTED NOT DETECTED Final   Parainfluenza Virus 4 NOT DETECTED NOT DETECTED Final   Respiratory Syncytial Virus NOT DETECTED NOT DETECTED Final   Bordetella pertussis NOT DETECTED NOT DETECTED Final   Bordetella Parapertussis NOT DETECTED NOT DETECTED Final   Chlamydophila pneumoniae NOT DETECTED NOT DETECTED Final   Mycoplasma pneumoniae NOT DETECTED NOT DETECTED Final    Comment: Performed at [facility], [address] [place], [place] [address]    RADIOLOGY STUDIES/RESULTS: No results found.   LOS: 2 days   Jeoffrey MassedShanker Zully Frane, MD  [facility]    To contact the attending provider between 7A-7P or the covering provider during after hours 7P-7A, please log into the web site [email or web address] and access using universal  password for that web site. If you do not have the password, please call the hospital operator.  05/28/2021, 10:54 AM

## 2021-05-28 NOTE — Progress Notes (Signed)
Bethany Rose 9:50 AM  Subjective: Patient seen and examined and case discussed with her mother and her pain is a little better and she is tolerating clear liquids he has no new complaints and we answered all of her mother's questions  Objective: Vital signs stable afebrile abdomen is soft decreased tenderness still present in the right upper quadrant no rebound CBC okay LFTs slowly decreasing except for alk phos slight increase ANA and IgG negative anti-smooth muscles pending  Assessment: Abdominal pain abnormal imaging abnormal liver tests questionable etiology   Plan: Will allow soft solid instructed them to go back to clear liquids if it makes her worse and will continue to follow and when bili probably less than 2 probably proceed with a HIDA scan unless patient significantly better  [facility]  office [phone] After 5PM or if no answer call [phone]

## 2021-05-29 DIAGNOSIS — K819 Cholecystitis, unspecified: Secondary | ICD-10-CM | POA: Diagnosis not present

## 2021-05-29 DIAGNOSIS — R7401 Elevation of levels of liver transaminase levels: Secondary | ICD-10-CM | POA: Diagnosis not present

## 2021-05-29 LAB — COMPREHENSIVE METABOLIC PANEL
ALT: 151 U/L — ABNORMAL HIGH (ref 0–44)
AST: 91 U/L — ABNORMAL HIGH (ref 15–41)
Albumin: 3.1 g/dL — ABNORMAL LOW (ref 3.5–5.0)
Alkaline Phosphatase: 371 U/L — ABNORMAL HIGH (ref 38–126)
Anion gap: 4 — ABNORMAL LOW (ref 5–15)
BUN: <5 mg/dL — ABNORMAL LOW (ref 6–20)
CO2: 25 mmol/L (ref 22–32)
Calcium: 9 mg/dL (ref 8.9–10.3)
Chloride: 105 mmol/L (ref 98–111)
Creatinine, Ser: 0.76 mg/dL (ref 0.44–1.00)
GFR, Estimated: >60 mL/min (ref >60)
Glucose, Bld: 101 mg/dL — ABNORMAL HIGH (ref 70–99)
Potassium: 4.2 mmol/L (ref 3.5–5.1)
Sodium: 134 mmol/L — ABNORMAL LOW (ref 135–145)
Total Bilirubin: 1.9 mg/dL — ABNORMAL HIGH (ref 0.3–1.2)
Total Protein: 6.1 g/dL — ABNORMAL LOW (ref 6.5–8.1)

## 2021-05-29 LAB — MITOCHONDRIAL ANTIBODIES: Mitochondrial M2 Ab, IgG: <20 Units (ref 0.0–20.0)

## 2021-05-29 LAB — ANTI-SMOOTH MUSCLE ANTIBODY, IGG: F-Actin IgG: 5 Units (ref 0–19)

## 2021-05-29 MED ORDER — POLYETHYLENE GLYCOL 3350 17 G PO PACK
17.0000 g | PACK | Freq: Every day | ORAL | Status: DC
  Administered 2021-05-30 – 2021-05-31 (×2): 17 g via ORAL
  Filled 2021-05-29 (×2): qty 1

## 2021-05-29 NOTE — Progress Notes (Signed)
Pharmacy Antibiotic Note  Bethany Rose is a 21 y.o. female admitted on 05/25/2021 with  intra-abdominal infection .  Pharmacy has been consulted for Ciprofloxacin dosing.  Plan: Ciprofloxacin 400 mg IV q12h Metronidazole 500 mg IV q12h Follow-up clinical status, renal function Follow-up cultures, LOT, de-escalate as able Follow-up ability to take meds PO  Height: 5' (152.4 cm) Weight: 49.9 kg (110 lb) IBW/kg (Calculated) : 45.5  Temp (24hrs), Avg:98 F (36.7 C), Min:97.7 F (36.5 C), Max:98.5 F (36.9 C)  Recent Labs  Lab 05/25/21 1150 05/25/21 2002 05/26/21 0401 05/27/21 0400 05/28/21 0119 05/29/21 0119  WBC 9.1 7.7 6.8 5.7 5.2  --   CREATININE 0.66  --  0.65 0.83 0.77 0.76     Estimated Creatinine Clearance: 80.6 mL/min (by C-G formula based on SCr of 0.76 mg/dL).    Allergies  Allergen Reactions   Amoxicillin Hives    Throat close   Penicillins Hives    Throat close    Antimicrobials this admission: Ciprofloxacin 5/25 >>  Metronidazole 5/25 >>   Microbiology results: 5/25 BCx: NGTD x 4 days   Thank you for allowing pharmacy to be a part of this patient's care.  March Rummage, PharmD PGY1 Pharmacy Resident  Please check AMION for all [facility] pharmacy phone numbers After 10:00 PM call main pharmacy [phone]

## 2021-05-29 NOTE — Progress Notes (Signed)
  Transition of Care [facility]) Screening Note   Patient Details  Name: Bethany Rose Date of Birth: 08/22/2000   Transition of Care [facility]) CM/SW Contact:    Harriet Masson, RN Phone Number: 05/29/2021, 8:35 AM    Transition of Care [facility]) has reviewed patient and no TOC needs have been identified at this time. We will continue to monitor patient advancement through interdisciplinary progression rounds. If new patient transition needs arise, please place a TOC consult.

## 2021-05-29 NOTE — Progress Notes (Signed)
PROGRESS NOTE        PATIENT DETAILS Name: Bethany Rose Age: 21 y.o. Sex: female Date of Birth: 2000-05-21 Admit Date: 05/25/2021 Admitting Physician Evalee Mutton Kristeen Mans, MD PCP:[place], [place]  Brief Summary: Patient is a 21 y.o.  female with no past medical history-presenting with 1 week history of mostly RUQ pain and intermittent nausea/vomiting.  She was found to have acute cholecystitis and obstructive jaundice.  She was subsequently admitted to [facility] for further evaluation and treatment.   Significant events: 5/25>> admit to [facility]-acute cholecystitis with obstructive jaundice.  Significant studies: 5/25>> CT abdomen/pelvis: Hepatosplenomegaly-no biliary ductal dilatation.  Gallbladder wall thickening with possible cholelithiasis/sludge. 5/25>> RUQ ultrasound: Gallbladder thickening with pericholecystic fluid and positive sonographic Murphy sign. 5/26>> MRI abdomen: Hepatomegaly with periportal edema-possible hepatitis-distended gallbladder/pericholecystic fluid.  No biliary ductal dilatation or choledocholithiasis.  Has splenomegaly. 5/26>> ANA: Negative  Significant microbiology data: 5/25>>  COVID PCR: Negative 5/25>> blood culture: No growth 5/25>> hepatitis A/B/C serology: Negative  Procedures: None  Consults: GI, general surgery  Subjective: Appears comfortable-still having RUQ pain-but the intensity/frequency seems to have decreased a bit.  Objective: Vitals: Blood pressure (!) 107/54, pulse 76, temperature 97.9 F (36.6 C), temperature source Oral, resp. rate 17, height 5' (1.524 m), weight 49.9 kg, last menstrual period 05/20/2021, SpO2 99 %.   Exam: Gen Exam:Alert awake-not in any distress HEENT:atraumatic, normocephalic Chest: B/L clear to auscultation anteriorly CVS:S1S2 regular Abdomen: Soft-but still with RUQ tenderness. Extremities:no edema Neurology: Non focal Skin: no rash   Pertinent  Labs/Radiology:    Latest Ref Rng & Units 05/28/2021    1:19 AM 05/27/2021    4:00 AM 05/26/2021    4:01 AM  CBC  WBC 4.0 - 10.5 K/uL 5.2   5.7   6.8    Hemoglobin 12.0 - 15.0 g/dL 13.4   13.3   12.9    Hematocrit 36.0 - 46.0 % 39.9   39.7   38.3    Platelets 150 - 400 K/uL 158   155   142      Lab Results  Component Value Date   NA 134 (L) 05/29/2021   K 4.2 05/29/2021   CL 105 05/29/2021   CO2 25 05/29/2021     Assessment/Plan: RUQ pain: Unclear whether this is acalculous cholecystitis or a autoimmune process causing hepatitis/splenomegaly.  LFTs are gradually improving-some minimal improvement continues in terms of RUQ pain.  Awaiting further recommendations from GI/[facility].  Remains on empiric IV antibiotics-serology for autoimmune hepatitis is still pending however ANA is negative.    BMI: Estimated body mass index is 21.48 kg/m as calculated from the following:   Height as of this encounter: 5' (1.524 m).   Weight as of this encounter: 49.9 kg.   Code status:   Code Status: Full Code   DVT Prophylaxis: enoxaparin (LOVENOX) injection 40 mg Start: 05/28/21 1145 SCDs Start: 05/25/21 2050   Family Communication: Mother at bedside   Disposition Plan: Status is: Observation The patient will require care spanning > 2 midnights and should be moved to inpatient because: Cholecystitis-on IV antibiotics-awaiting GI evaluation/MRCP-likely will require laparoscopic cholecystectomy prior to discharge.  Not stable for discharge-likely requires another 1-2 days of hospitalization.   Planned Discharge Destination:Home   Diet: Diet Order             DIET SOFT Room  service appropriate? Yes; Fluid consistency: Thin  Diet effective now                     Antimicrobial agents: Anti-infectives (From admission, onward)    Start     Dose/Rate Route Frequency Ordered Stop   05/26/21 0600  metroNIDAZOLE (FLAGYL) IVPB 500 mg        500 mg 100 mL/hr over 60 Minutes Intravenous  Every 12 hours 05/25/21 1902     05/26/21 0600  ciprofloxacin (CIPRO) IVPB 400 mg        400 mg 200 mL/hr over 60 Minutes Intravenous Every 12 hours 05/25/21 1903     05/25/21 1730  ciprofloxacin (CIPRO) IVPB 400 mg       See Hyperspace for full Linked Orders Report.   400 mg 200 mL/hr over 60 Minutes Intravenous  Once 05/25/21 1723 05/25/21 2217   05/25/21 1730  metroNIDAZOLE (FLAGYL) IVPB 500 mg       See Hyperspace for full Linked Orders Report.   500 mg 100 mL/hr over 60 Minutes Intravenous  Once 05/25/21 1723 05/25/21 2048        MEDICATIONS: Scheduled Meds:  enoxaparin (LOVENOX) injection  40 mg Subcutaneous Q24H   Continuous Infusions:  ciprofloxacin 400 mg (05/29/21 0702)   metronidazole 500 mg (05/29/21 0546)   PRN Meds:.HYDROmorphone (DILAUDID) injection, naLOXone (NARCAN)  injection, ondansetron **OR** ondansetron (ZOFRAN) IV   I have personally reviewed following labs and imaging studies  LABORATORY DATA: CBC: Recent Labs  Lab 05/25/21 1150 05/25/21 2002 05/26/21 0401 05/27/21 0400 05/28/21 0119  WBC 9.1 7.7 6.8 5.7 5.2  NEUTROABS  --  2.6  --   --  1.5*  HGB 14.7 12.8 12.9 13.3 13.4  HCT 45.5 39.9 38.3 39.7 39.9  MCV 91.7 93.0 89.3 89.2 89.7  PLT 142* 137* 142* 155 158     Basic Metabolic Panel: Recent Labs  Lab 05/25/21 1150 05/25/21 2002 05/26/21 0401 05/27/21 0400 05/28/21 0119 05/29/21 0119  NA 138  --  137 136 137 134*  K 3.8  --  4.4 4.3 4.0 4.2  CL 106  --  106 101 104 105  CO2 22  --  24 22 24 25   GLUCOSE 128*  --  99 76 90 101*  BUN 6  --  <5* 7 5* <5*  CREATININE 0.66  --  0.65 0.83 0.77 0.76  CALCIUM 9.1  --  8.9 9.3 9.2 9.0  MG  --  1.6* 1.9 1.7  --   --   PHOS  --  3.9 4.2  --   --   --      GFR: Estimated Creatinine Clearance: 80.6 mL/min (by C-G formula based on SCr of 0.76 mg/dL).  Liver Function Tests: Recent Labs  Lab 05/25/21 1150 05/26/21 0401 05/27/21 0400 05/28/21 0119 05/29/21 0119  AST 296* 170* 120*  92* 91*  ALT 376* 279* 221* 182* 151*  ALKPHOS 500* 403* 396* 423* 371*  BILITOT 5.6* 3.9* 3.4* 2.9* 1.9*  PROT 6.8 5.9* 6.2* 6.2* 6.1*  ALBUMIN 3.4* 3.0* 3.1* 3.2* 3.1*    Recent Labs  Lab 05/25/21 1150  LIPASE 31    Recent Labs  Lab 05/25/21 2002  AMMONIA 30     Coagulation Profile: Recent Labs  Lab 05/25/21 2002 05/26/21 0401  INR 1.0 0.9     Cardiac Enzymes: Recent Labs  Lab 05/25/21 2002  CKTOTAL 24*     BNP (last 3 results) No results  for input(s): PROBNP in the last 8760 hours.  Lipid Profile: No results for input(s): CHOL, HDL, LDLCALC, TRIG, CHOLHDL, LDLDIRECT in the last 72 hours.  Thyroid Function Tests: No results for input(s): TSH, T4TOTAL, FREET4, T3FREE, THYROIDAB in the last 72 hours.   Anemia Panel: No results for input(s): VITAMINB12, FOLATE, FERRITIN, TIBC, IRON, RETICCTPCT in the last 72 hours.  Urine analysis:    Component Value Date/Time   COLORURINE AMBER (A) 05/25/2021 1143   APPEARANCEUR CLEAR 05/25/2021 1143   LABSPEC 1.015 05/25/2021 1143   PHURINE 5.0 05/25/2021 1143   GLUCOSEU NEGATIVE 05/25/2021 1143   HGBUR NEGATIVE 05/25/2021 1143   BILIRUBINUR SMALL (A) 05/25/2021 1143   KETONESUR NEGATIVE 05/25/2021 1143   PROTEINUR NEGATIVE 05/25/2021 1143   NITRITE NEGATIVE 05/25/2021 1143   LEUKOCYTESUR NEGATIVE 05/25/2021 1143    Sepsis Labs: Lactic Acid, Venous No results found for: LATICACIDVEN  MICROBIOLOGY: Recent Results (from the past 240 hour(s))  Blood culture (routine x 2)     Status: None (Preliminary result)   Collection Time: 05/25/21  5:24 PM   Specimen: BLOOD LEFT ARM  Result Value Ref Range Status   Specimen Description BLOOD LEFT ARM  Final   Special Requests   Final    BOTTLES DRAWN AEROBIC AND ANAEROBIC Blood Culture results may not be optimal due to an inadequate volume of blood received in culture bottles   Culture   Final    NO GROWTH 4 DAYS Performed at [facility], [place] [address], [place], [place] [address]    Report Status PENDING  Incomplete  Blood culture (routine x 2)     Status: None (Preliminary result)   Collection Time: 05/25/21  5:44 PM   Specimen: BLOOD RIGHT HAND  Result Value Ref Range Status   Specimen Description BLOOD RIGHT HAND  Final   Special Requests   Final    BOTTLES DRAWN AEROBIC AND ANAEROBIC Blood Culture adequate volume   Culture   Final    NO GROWTH 4 DAYS Performed at [facility], [place] [address], [place], [place] [address]    Report Status PENDING  Incomplete  SARS Coronavirus 2 by RT PCR (hospital order, performed in [facility] hospital lab) *cepheid single result test* Anterior Nasal Swab     Status: None   Collection Time: 05/25/21  6:46 PM   Specimen: Anterior Nasal Swab  Result Value Ref Range Status   SARS Coronavirus 2 by RT PCR NEGATIVE NEGATIVE Final    Comment: (NOTE) SARS-CoV-2 target nucleic acids are NOT DETECTED.  The SARS-CoV-2 RNA is generally detectable in upper and lower respiratory specimens during the acute phase of infection. The lowest concentration of SARS-CoV-2 viral copies this assay can detect is 250 copies / mL. A negative result does not preclude SARS-CoV-2 infection and should not be used as the sole basis for treatment or other patient management decisions.  A negative result may occur with improper specimen collection / handling, submission of specimen other than nasopharyngeal swab, presence of viral mutation(s) within the areas targeted by this assay, and inadequate number of viral copies (<250 copies / mL). A negative result must be combined with clinical observations, patient history, and epidemiological information.  Fact Sheet for Patients:   https://www.patel.info/  Fact Sheet for Healthcare Providers: https://hall.com/  This test is not yet approved or  cleared by the [place] FDA and has been authorized for detection and/or  diagnosis of SARS-CoV-2 by FDA under an Emergency Use  Authorization (EUA).  This EUA will remain in effect (meaning this test can be used) for the duration of the COVID-19 declaration under Section 564(b)(1) of the Act, 21 U.S.C. section 360bbb-3(b)(1), unless the authorization is terminated or revoked sooner.  Performed at [facility], [place] [address] [place], [place] [address]   Respiratory (~20 pathogens) panel by PCR     Status: None   Collection Time: 05/25/21  7:16 PM   Specimen: Nasopharyngeal Swab; Respiratory  Result Value Ref Range Status   Adenovirus NOT DETECTED NOT DETECTED Final   Coronavirus 229E NOT DETECTED NOT DETECTED Final    Comment: (NOTE) The Coronavirus on the Respiratory Panel, DOES NOT test for the novel  Coronavirus (2019 nCoV)    Coronavirus HKU1 NOT DETECTED NOT DETECTED Final   Coronavirus NL63 NOT DETECTED NOT DETECTED Final   Coronavirus OC43 NOT DETECTED NOT DETECTED Final   Metapneumovirus NOT DETECTED NOT DETECTED Final   Rhinovirus / Enterovirus NOT DETECTED NOT DETECTED Final   Influenza A NOT DETECTED NOT DETECTED Final   Influenza B NOT DETECTED NOT DETECTED Final   Parainfluenza Virus 1 NOT DETECTED NOT DETECTED Final   Parainfluenza Virus 2 NOT DETECTED NOT DETECTED Final   Parainfluenza Virus 3 NOT DETECTED NOT DETECTED Final   Parainfluenza Virus 4 NOT DETECTED NOT DETECTED Final   Respiratory Syncytial Virus NOT DETECTED NOT DETECTED Final   Bordetella pertussis NOT DETECTED NOT DETECTED Final   Bordetella Parapertussis NOT DETECTED NOT DETECTED Final   Chlamydophila pneumoniae NOT DETECTED NOT DETECTED Final   Mycoplasma pneumoniae NOT DETECTED NOT DETECTED Final    Comment: Performed at [facility], [place]. [address], [place], [place] [address]    RADIOLOGY STUDIES/RESULTS: No results found.   LOS: 3 days   Oren Binet, MD  [facility]    To contact the attending provider between 7A-7P or the covering  provider during after hours 7P-7A, please log into the web site [email or web address] and access using universal [place] password for that web site. If you do not have the password, please call the hospital operator.  05/29/2021, 9:55 AM

## 2021-05-29 NOTE — Progress Notes (Signed)
[facility] Progress Note  Bethany Rose 20 y.o. 11-20-2000  CC: Right upper quadrant abdominal pain, abnormal LFTs   Subjective: Patient seen and examined at bedside.  Her abdominal pain has improved but not resolved.  She still requiring pain medication.  Tolerating diet.  No bowel movement since admission.  ROS : Afebrile.  Negative for chest pain.   Objective: Vital signs in last 24 hours: Vitals:   05/29/21 0014 05/29/21 0818  BP: 113/60 (!) 107/54  Pulse: 88 76  Resp: 18 17  Temp: 98.5 F (36.9 C) 97.9 F (36.6 C)  SpO2: 99%     Physical Exam:  General:  Alert, cooperative, no distress, appears stated age  Head:  Normocephalic, without obvious abnormality, atraumatic  Eyes:  , EOM's intact,   Lungs:   Clear to auscultation bilaterally, respirations unlabored  Heart:  Regular rate and rhythm, S1, S2 normal  Abdomen:   Soft, right upper quadrant tenderness to palpation, abdomen is soft, bowel sounds present.  No peritoneal signs  Extremities: Extremities normal, atraumatic, no  edema  Pulses: 2+ and symmetric    Lab Results: Recent Labs    05/27/21 0400 05/28/21 0119 05/29/21 0119  NA 136 137 134*  K 4.3 4.0 4.2  CL 101 104 105  CO2 22 24 25   GLUCOSE 76 90 101*  BUN 7 5* <5*  CREATININE 0.83 0.77 0.76  CALCIUM 9.3 9.2 9.0  MG 1.7  --   --    Recent Labs    05/28/21 0119 05/29/21 0119  AST 92* 91*  ALT 182* 151*  ALKPHOS 423* 371*  BILITOT 2.9* 1.9*  PROT 6.2* 6.1*  ALBUMIN 3.2* 3.1*   Recent Labs    05/27/21 0400 05/28/21 0119  WBC 5.7 5.2  NEUTROABS  --  1.5*  HGB 13.3 13.4  HCT 39.7 39.9  MCV 89.2 89.7  PLT 155 158   No results for input(s): LABPROT, INR in the last 72 hours.    Assessment/Plan: -Right upper quadrant abdominal pain with abnormal LFTs.  Initial CT scan showed gallbladder wall thickening concerning for cholecystitis.  Follow-up ultrasound also showed gallbladder thickening with pericholecystic fluid and  positive Murphy sign.  MRI MRCP with contrast on May 26, 2021 showed distended gallbladder with some pericholecystic fluid along with hepatomegaly and periportal edema.  Could be hepatocellular process versus cholecystitis.  -Constipation.  No bowel movement since admission  Recommendations --------------------------- -Patient with right upper quadrant pain for last 10 days now.  Finding could be from underlying cholecystitis.  Recommend HIDA scan. -LFTs improving.  Tolerating diet. -Hepatitis panel negative.  Normal ANA. -AMA May 28, 2021 pending. -Add MiraLAX -GI will follow   Ace Gins MD, FACP 05/29/2021, 11:14 AM  Contact #  [phone]

## 2021-05-30 ENCOUNTER — Inpatient Hospital Stay (HOSPITAL_COMMUNITY): Payer: BC Managed Care – PPO

## 2021-05-30 DIAGNOSIS — K819 Cholecystitis, unspecified: Secondary | ICD-10-CM | POA: Diagnosis not present

## 2021-05-30 DIAGNOSIS — R7401 Elevation of levels of liver transaminase levels: Secondary | ICD-10-CM | POA: Diagnosis not present

## 2021-05-30 LAB — CULTURE, BLOOD (ROUTINE X 2)
Culture: NO GROWTH
Culture: NO GROWTH
Special Requests: ADEQUATE

## 2021-05-30 LAB — COMPREHENSIVE METABOLIC PANEL
ALT: 118 U/L — ABNORMAL HIGH (ref 0–44)
AST: 65 U/L — ABNORMAL HIGH (ref 15–41)
Albumin: 3.2 g/dL — ABNORMAL LOW (ref 3.5–5.0)
Alkaline Phosphatase: 310 U/L — ABNORMAL HIGH (ref 38–126)
Anion gap: 7 (ref 5–15)
BUN: <5 mg/dL — ABNORMAL LOW (ref 6–20)
CO2: 24 mmol/L (ref 22–32)
Calcium: 9 mg/dL (ref 8.9–10.3)
Chloride: 105 mmol/L (ref 98–111)
Creatinine, Ser: 0.77 mg/dL (ref 0.44–1.00)
GFR, Estimated: >60 mL/min (ref >60)
Glucose, Bld: 99 mg/dL (ref 70–99)
Potassium: 4.1 mmol/L (ref 3.5–5.1)
Sodium: 136 mmol/L (ref 135–145)
Total Bilirubin: 1.8 mg/dL — ABNORMAL HIGH (ref 0.3–1.2)
Total Protein: 6.1 g/dL — ABNORMAL LOW (ref 6.5–8.1)

## 2021-05-30 LAB — GLUCOSE, CAPILLARY
Glucose-Capillary: 118 mg/dL — ABNORMAL HIGH (ref 70–99)
Glucose-Capillary: 84 mg/dL (ref 70–99)
Glucose-Capillary: 113 mg/dL — ABNORMAL HIGH (ref 70–99)

## 2021-05-30 MED ORDER — HYDROMORPHONE HCL 1 MG/ML IJ SOLN
0.5000 mg | Freq: Once | INTRAMUSCULAR | Status: AC
  Administered 2021-05-30: 0.5 mg via INTRAVENOUS
  Filled 2021-05-30: qty 0.5

## 2021-05-30 MED ORDER — CIPROFLOXACIN HCL 500 MG PO TABS
500.0000 mg | ORAL_TABLET | Freq: Two times a day (BID) | ORAL | Status: DC
  Administered 2021-05-30: 500 mg via ORAL
  Filled 2021-05-30: qty 1

## 2021-05-30 MED ORDER — TECHNETIUM TC 99M MEBROFENIN IV KIT
5.0000 | PACK | Freq: Once | INTRAVENOUS | Status: AC | PRN
  Administered 2021-05-30: 5 via INTRAVENOUS

## 2021-05-30 MED ORDER — METRONIDAZOLE 500 MG PO TABS
500.0000 mg | ORAL_TABLET | Freq: Two times a day (BID) | ORAL | Status: DC
Start: 2021-05-30 — End: 2021-05-31
  Administered 2021-05-30: 500 mg via ORAL
  Filled 2021-05-30: qty 1

## 2021-05-30 MED ORDER — SERTRALINE HCL 100 MG PO TABS
100.0000 mg | ORAL_TABLET | Freq: Every day | ORAL | Status: DC
  Administered 2021-05-30 – 2021-05-31 (×2): 100 mg via ORAL
  Filled 2021-05-30 (×2): qty 1

## 2021-05-30 MED ORDER — TRAZODONE HCL 50 MG PO TABS
50.0000 mg | ORAL_TABLET | Freq: Every day | ORAL | Status: DC
  Administered 2021-05-30: 50 mg via ORAL
  Filled 2021-05-30: qty 1

## 2021-05-30 MED ORDER — AMPHETAMINE-DEXTROAMPHET ER 10 MG PO CP24
30.0000 mg | ORAL_CAPSULE | Freq: Every morning | ORAL | Status: DC
  Administered 2021-05-31: 30 mg via ORAL
  Filled 2021-05-30: qty 3

## 2021-05-30 NOTE — Progress Notes (Signed)
-   Attempted to see patient twice today.  Patient not in the room.  Likely getting HIDA scan.   Kathi Der MD, FACP 05/30/2021, 2:52 PM  Contact #  [phone]

## 2021-05-30 NOTE — Progress Notes (Addendum)
PROGRESS NOTE        PATIENT DETAILS Name: Bethany Rose Age: 21 y.o. Sex: female Date of Birth: Nov 25, 2000 Admit Date: 05/25/2021 Admitting Physician Evalee Mutton Kristeen Mans, MD PCP:Pa, [facility]  Brief Summary: Patient is a 21 y.o.  female with no past medical history-presenting with 1 week history of mostly RUQ pain and intermittent nausea/vomiting.  She was found to have acute cholecystitis and obstructive jaundice.  She was subsequently admitted to [facility] for further evaluation and treatment.   Significant events: 5/25>> admit to [facility]-acute cholecystitis with obstructive jaundice.  Significant studies: 5/25>> CT abdomen/pelvis: Hepatosplenomegaly-no biliary ductal dilatation.  Gallbladder wall thickening with possible cholelithiasis/sludge. 5/25>> RUQ ultrasound: Gallbladder thickening with pericholecystic fluid and positive sonographic Murphy sign. 5/26>> MRI abdomen: Hepatomegaly with periportal edema-possible hepatitis-distended gallbladder/pericholecystic fluid.  No biliary ductal dilatation or choledocholithiasis.  Has splenomegaly. 5/26>> ANA: Negative 5/26>> AMA/ASMA AB: Negative  Significant microbiology data: 5/25>>  COVID PCR: Negative 5/25>> blood culture: No growth 5/25>> hepatitis A/B/C serology: Negative  Procedures: None  Consults: GI, general surgery  Subjective: Continues to have RUQ pain-although slightly better than the past few days.  Requesting her psych medications to be resumed.  Objective: Vitals: Blood pressure 112/65, pulse 76, temperature 98.5 F (36.9 C), temperature source Oral, resp. rate 16, height 5' (1.524 m), weight 49.9 kg, last menstrual period 05/20/2021, SpO2 99 %.   Exam: Gen Exam:Alert awake-not in any distress HEENT:atraumatic, normocephalic Chest: B/L clear to auscultation anteriorly CVS:S1S2 regular Abdomen: Soft-continues to have RUQ tenderness. Extremities:no edema Neurology:  Non focal Skin: no rash   Pertinent Labs/Radiology:    Latest Ref Rng & Units 05/28/2021    1:19 AM 05/27/2021    4:00 AM 05/26/2021    4:01 AM  CBC  WBC 4.0 - 10.5 K/uL 5.2   5.7   6.8    Hemoglobin 12.0 - 15.0 g/dL 13.4   13.3   12.9    Hematocrit 36.0 - 46.0 % 39.9   39.7   38.3    Platelets 150 - 400 K/uL 158   155   142      Lab Results  Component Value Date   NA 136 05/30/2021   K 4.1 05/30/2021   CL 105 05/30/2021   CO2 24 05/30/2021     Assessment/Plan: RUQ pain: Unclear whether this is acalculous cholecystitis or a autoimmune process causing hepatitis/splenomegaly.  LFTs gradually improving-autoimmune work-up negative.  HIDA scan scheduled for later today.  In the interim-continue supportive care and IV antibiotics.  Await further recommendations from gastroenterology.   Anxiety/depression: We will go ahead and resume her psychiatric medications today.  BMI: Estimated body mass index is 21.48 kg/m as calculated from the following:   Height as of this encounter: 5' (1.524 m).   Weight as of this encounter: 49.9 kg.   Code status:   Code Status: Full Code   DVT Prophylaxis: enoxaparin (LOVENOX) injection 40 mg Start: 05/28/21 1145 SCDs Start: 05/25/21 2050   Family Communication: Mother at bedside   Disposition Plan: Status is: Observation The patient will require care spanning > 2 midnights and should be moved to inpatient because: Cholecystitis-on IV antibiotics-awaiting GI evaluation/MRCP-likely will require laparoscopic cholecystectomy prior to discharge.  Not stable for discharge-likely requires another 1-2 days of hospitalization.   Planned Discharge Destination:Home   Diet: Diet Order  DIET SOFT Room service appropriate? Yes; Fluid consistency: Thin  Diet effective now                     Antimicrobial agents: Anti-infectives (From admission, onward)    Start     Dose/Rate Route Frequency Ordered Stop   05/30/21 2200   metroNIDAZOLE (FLAGYL) tablet 500 mg        500 mg Oral Every 12 hours 05/30/21 1049     05/30/21 2000  ciprofloxacin (CIPRO) tablet 500 mg        500 mg Oral 2 times daily 05/30/21 1049     05/26/21 0600  metroNIDAZOLE (FLAGYL) IVPB 500 mg  Status:  Discontinued        500 mg 100 mL/hr over 60 Minutes Intravenous Every 12 hours 05/25/21 1902 05/30/21 1049   05/26/21 0600  ciprofloxacin (CIPRO) IVPB 400 mg  Status:  Discontinued        400 mg 200 mL/hr over 60 Minutes Intravenous Every 12 hours 05/25/21 1903 05/30/21 1049   05/25/21 1730  ciprofloxacin (CIPRO) IVPB 400 mg       See Hyperspace for full Linked Orders Report.   400 mg 200 mL/hr over 60 Minutes Intravenous  Once 05/25/21 1723 05/25/21 2217   05/25/21 1730  metroNIDAZOLE (FLAGYL) IVPB 500 mg       See Hyperspace for full Linked Orders Report.   500 mg 100 mL/hr over 60 Minutes Intravenous  Once 05/25/21 1723 05/25/21 2048        MEDICATIONS: Scheduled Meds:  ciprofloxacin  500 mg Oral BID   enoxaparin (LOVENOX) injection  40 mg Subcutaneous Q24H   metroNIDAZOLE  500 mg Oral Q12H   polyethylene glycol  17 g Oral Daily   Continuous Infusions:   PRN Meds:.HYDROmorphone (DILAUDID) injection, naLOXone (NARCAN)  injection, ondansetron **OR** ondansetron (ZOFRAN) IV   I have personally reviewed following labs and imaging studies  LABORATORY DATA: CBC: Recent Labs  Lab 05/25/21 1150 05/25/21 2002 05/26/21 0401 05/27/21 0400 05/28/21 0119  WBC 9.1 7.7 6.8 5.7 5.2  NEUTROABS  --  2.6  --   --  1.5*  HGB 14.7 12.8 12.9 13.3 13.4  HCT 45.5 39.9 38.3 39.7 39.9  MCV 91.7 93.0 89.3 89.2 89.7  PLT 142* 137* 142* 155 158     Basic Metabolic Panel: Recent Labs  Lab 05/25/21 2002 05/26/21 0401 05/27/21 0400 05/28/21 0119 05/29/21 0119 05/30/21 0034  NA  --  137 136 137 134* 136  K  --  4.4 4.3 4.0 4.2 4.1  CL  --  106 101 104 105 105  CO2  --  24 22 24 25 24   GLUCOSE  --  99 76 90 101* 99  BUN  --  <5* 7  5* <5* <5*  CREATININE  --  0.65 0.83 0.77 0.76 0.77  CALCIUM  --  8.9 9.3 9.2 9.0 9.0  MG 1.6* 1.9 1.7  --   --   --   PHOS 3.9 4.2  --   --   --   --      GFR: Estimated Creatinine Clearance: 80.6 mL/min (by C-G formula based on SCr of 0.77 mg/dL).  Liver Function Tests: Recent Labs  Lab 05/26/21 0401 05/27/21 0400 05/28/21 0119 05/29/21 0119 05/30/21 0034  AST 170* 120* 92* 91* 65*  ALT 279* 221* 182* 151* 118*  ALKPHOS 403* 396* 423* 371* 310*  BILITOT 3.9* 3.4* 2.9* 1.9* 1.8*  PROT 5.9* 6.2*  6.2* 6.1* 6.1*  ALBUMIN 3.0* 3.1* 3.2* 3.1* 3.2*    Recent Labs  Lab 05/25/21 1150  LIPASE 31    Recent Labs  Lab 05/25/21 2002  AMMONIA 30     Coagulation Profile: Recent Labs  Lab 05/25/21 2002 05/26/21 0401  INR 1.0 0.9     Cardiac Enzymes: Recent Labs  Lab 05/25/21 2002  CKTOTAL 24*     BNP (last 3 results) No results for input(s): PROBNP in the last 8760 hours.  Lipid Profile: No results for input(s): CHOL, HDL, LDLCALC, TRIG, CHOLHDL, LDLDIRECT in the last 72 hours.  Thyroid Function Tests: No results for input(s): TSH, T4TOTAL, FREET4, T3FREE, THYROIDAB in the last 72 hours.   Anemia Panel: No results for input(s): VITAMINB12, FOLATE, FERRITIN, TIBC, IRON, RETICCTPCT in the last 72 hours.  Urine analysis:    Component Value Date/Time   COLORURINE AMBER (A) 05/25/2021 1143   APPEARANCEUR CLEAR 05/25/2021 1143   LABSPEC 1.015 05/25/2021 1143   PHURINE 5.0 05/25/2021 1143   GLUCOSEU NEGATIVE 05/25/2021 1143   HGBUR NEGATIVE 05/25/2021 1143   BILIRUBINUR SMALL (A) 05/25/2021 1143   KETONESUR NEGATIVE 05/25/2021 1143   PROTEINUR NEGATIVE 05/25/2021 1143   NITRITE NEGATIVE 05/25/2021 1143   LEUKOCYTESUR NEGATIVE 05/25/2021 1143    Sepsis Labs: Lactic Acid, Venous No results found for: LATICACIDVEN  MICROBIOLOGY: Recent Results (from the past 240 hour(s))  Blood culture (routine x 2)     Status: None   Collection Time: 05/25/21   5:24 PM   Specimen: BLOOD LEFT ARM  Result Value Ref Range Status   Specimen Description BLOOD LEFT ARM  Final   Special Requests   Final    BOTTLES DRAWN AEROBIC AND ANAEROBIC Blood Culture results may not be optimal due to an inadequate volume of blood received in culture bottles   Culture   Final    NO GROWTH 5 DAYS Performed at [facility], [address], [place], [place] [address]    Report Status 05/30/2021 FINAL  Final  Blood culture (routine x 2)     Status: None   Collection Time: 05/25/21  5:44 PM   Specimen: BLOOD RIGHT HAND  Result Value Ref Range Status   Specimen Description BLOOD RIGHT HAND  Final   Special Requests   Final    BOTTLES DRAWN AEROBIC AND ANAEROBIC Blood Culture adequate volume   Culture   Final    NO GROWTH 5 DAYS Performed at [facility], [address], [place], [place] [address]    Report Status 05/30/2021 FINAL  Final  SARS Coronavirus 2 by RT PCR (hospital order, performed in [facility] hospital lab) *cepheid single result test* Anterior Nasal Swab     Status: None   Collection Time: 05/25/21  6:46 PM   Specimen: Anterior Nasal Swab  Result Value Ref Range Status   SARS Coronavirus 2 by RT PCR NEGATIVE NEGATIVE Final    Comment: (NOTE) SARS-CoV-2 target nucleic acids are NOT DETECTED.  The SARS-CoV-2 RNA is generally detectable in upper and lower respiratory specimens during the acute phase of infection. The lowest concentration of SARS-CoV-2 viral copies this assay can detect is 250 copies / mL. A negative result does not preclude SARS-CoV-2 infection and should not be used as the sole basis for treatment or other patient management decisions.  A negative result may occur with improper specimen collection / handling, submission of specimen other than nasopharyngeal swab, presence of viral mutation(s) within the areas  targeted by this assay, and inadequate number of viral copies (<250 copies / mL). A negative result must be  combined with clinical observations, patient history, and epidemiological information.  Fact Sheet for Patients:   https://www.patel.info/  Fact Sheet for Healthcare Providers: https://hall.com/  This test is not yet approved or  cleared by the [place] FDA and has been authorized for detection and/or diagnosis of SARS-CoV-2 by FDA under an Emergency Use Authorization (EUA).  This EUA will remain in effect (meaning this test can be used) for the duration of the COVID-19 declaration under Section 564(b)(1) of the Act, 21 U.S.C. section 360bbb-3(b)(1), unless the authorization is terminated or revoked sooner.  Performed at [facility], [place] [address], [place], [place] [address]   Respiratory (~20 pathogens) panel by PCR     Status: None   Collection Time: 05/25/21  7:16 PM   Specimen: Nasopharyngeal Swab; Respiratory  Result Value Ref Range Status   Adenovirus NOT DETECTED NOT DETECTED Final   Coronavirus 229E NOT DETECTED NOT DETECTED Final    Comment: (NOTE) The Coronavirus on the Respiratory Panel, DOES NOT test for the novel  Coronavirus (2019 nCoV)    Coronavirus HKU1 NOT DETECTED NOT DETECTED Final   Coronavirus NL63 NOT DETECTED NOT DETECTED Final   Coronavirus OC43 NOT DETECTED NOT DETECTED Final   Metapneumovirus NOT DETECTED NOT DETECTED Final   Rhinovirus / Enterovirus NOT DETECTED NOT DETECTED Final   Influenza A NOT DETECTED NOT DETECTED Final   Influenza B NOT DETECTED NOT DETECTED Final   Parainfluenza Virus 1 NOT DETECTED NOT DETECTED Final   Parainfluenza Virus 2 NOT DETECTED NOT DETECTED Final   Parainfluenza Virus 3 NOT DETECTED NOT DETECTED Final   Parainfluenza Virus 4 NOT DETECTED NOT DETECTED Final   Respiratory Syncytial Virus NOT DETECTED NOT DETECTED Final   Bordetella pertussis NOT DETECTED NOT DETECTED Final   Bordetella Parapertussis NOT DETECTED NOT DETECTED Final   Chlamydophila pneumoniae  NOT DETECTED NOT DETECTED Final   Mycoplasma pneumoniae NOT DETECTED NOT DETECTED Final    Comment: Performed at [facility], [place], [place], [place] [address]    RADIOLOGY STUDIES/RESULTS: No results found.   LOS: 4 days   Oren Binet, MD  [facility]    To contact the attending provider between 7A-7P or the covering provider during after hours 7P-7A, please log into the web site [email or web address] and access using universal  password for that web site. If you do not have the password, please call the hospital operator.  05/30/2021, 12:08 PM

## 2021-05-31 ENCOUNTER — Other Ambulatory Visit (HOSPITAL_COMMUNITY): Payer: Self-pay

## 2021-05-31 DIAGNOSIS — R7401 Elevation of levels of liver transaminase levels: Secondary | ICD-10-CM | POA: Diagnosis not present

## 2021-05-31 DIAGNOSIS — K819 Cholecystitis, unspecified: Secondary | ICD-10-CM | POA: Diagnosis not present

## 2021-05-31 LAB — IRON AND TIBC
Iron: 76 ug/dL (ref 28–170)
Saturation Ratios: 15 % (ref 10.4–31.8)
TIBC: 493 ug/dL — ABNORMAL HIGH (ref 250–450)
UIBC: 417 ug/dL

## 2021-05-31 LAB — COMPREHENSIVE METABOLIC PANEL
ALT: 100 U/L — ABNORMAL HIGH (ref 0–44)
AST: 65 U/L — ABNORMAL HIGH (ref 15–41)
Albumin: 3.4 g/dL — ABNORMAL LOW (ref 3.5–5.0)
Alkaline Phosphatase: 307 U/L — ABNORMAL HIGH (ref 38–126)
Anion gap: 7 (ref 5–15)
BUN: <5 mg/dL — ABNORMAL LOW (ref 6–20)
CO2: 25 mmol/L (ref 22–32)
Calcium: 9.4 mg/dL (ref 8.9–10.3)
Chloride: 103 mmol/L (ref 98–111)
Creatinine, Ser: 0.77 mg/dL (ref 0.44–1.00)
GFR, Estimated: >60 mL/min (ref >60)
Glucose, Bld: 114 mg/dL — ABNORMAL HIGH (ref 70–99)
Potassium: 4.1 mmol/L (ref 3.5–5.1)
Sodium: 135 mmol/L (ref 135–145)
Total Bilirubin: 1.8 mg/dL — ABNORMAL HIGH (ref 0.3–1.2)
Total Protein: 6.3 g/dL — ABNORMAL LOW (ref 6.5–8.1)

## 2021-05-31 LAB — MAGNESIUM: Magnesium: 1.9 mg/dL (ref 1.7–2.4)

## 2021-05-31 LAB — FERRITIN: Ferritin: 65 ng/mL (ref 11–307)

## 2021-05-31 MED ORDER — POLYETHYLENE GLYCOL 3350 17 GM/SCOOP PO POWD
17.0000 g | Freq: Every day | ORAL | 0 refills | Status: AC
  Filled 2021-05-31: qty 238, 14d supply, fill #0

## 2021-05-31 MED ORDER — OXYCODONE HCL 5 MG PO TABS
5.0000 mg | ORAL_TABLET | Freq: Four times a day (QID) | ORAL | Status: DC | PRN
  Administered 2021-05-31: 5 mg via ORAL
  Filled 2021-05-31: qty 1

## 2021-05-31 MED ORDER — OXYCODONE HCL 5 MG PO TABS
5.0000 mg | ORAL_TABLET | Freq: Four times a day (QID) | ORAL | 0 refills | Status: AC | PRN
  Filled 2021-05-31: qty 30, 7d supply, fill #0

## 2021-05-31 NOTE — Progress Notes (Signed)
Informed by RN-patient/mother requesting d/c home. I subsequently spoke with patient-claims the pain has been the best it has been since her hospitalization, and is requesting dishcharge. A few days supply of narcotics have been sent to [facility] pharmacy. [facility] aware of discharge plans-they will arrange for outpatient follow up. See d/c summary for details.

## 2021-05-31 NOTE — Progress Notes (Signed)
PROGRESS NOTE        PATIENT DETAILS Name: Bethany Rose Age: 21 y.o. Sex: female Date of Birth: [birth year] Admit Date: 05/25/2021 Admitting Physician Dewayne Shorter Levora Dredge, MD PCP:[facility], [facility]  Brief Summary: Patient is a 21 y.o.  female with no past medical history-presenting with 1 week history of mostly RUQ pain and intermittent nausea/vomiting.  She was found to have acute cholecystitis and obstructive jaundice.  She was subsequently admitted to [facility] for further evaluation and treatment.   Significant events: 5/25>> admit to [facility]-acute cholecystitis with obstructive jaundice.  Significant studies: 5/25>> CT abdomen/pelvis: Hepatosplenomegaly-no biliary ductal dilatation.  Gallbladder wall thickening with possible cholelithiasis/sludge. 5/25>> RUQ ultrasound: Gallbladder thickening with pericholecystic fluid and positive sonographic Murphy sign. 5/26>> MRI abdomen: Hepatomegaly with periportal edema-possible hepatitis-distended gallbladder/pericholecystic fluid.  No biliary ductal dilatation or choledocholithiasis.  Has splenomegaly. 5/26>> ANA: Negative 5/26>> AMA/ASMA Ab: Negative 5/30>> HIDA scan: Patent cystic duct-normal EF 5/31>> ceruloplasmin level: Pending  Significant microbiology data: 5/25>>  COVID PCR: Negative 5/25>> blood culture: No growth 5/25>> hepatitis A/B/C serology: Negative   Procedures: None  Consults: GI, general surgery  Subjective: Continues to have RUQ pain.  Objective: Vitals: Blood pressure 103/60, pulse 83, temperature 97.9 F (36.6 C), temperature source Oral, resp. rate 18, height 5' (1.524 m), weight 49.9 kg, last menstrual period 05/20/2021, SpO2 96 %.   Exam: Gen Exam:Alert awake-not in any distress HEENT:atraumatic, normocephalic Chest: B/L clear to auscultation anteriorly CVS:S1S2 regular Abdomen: Soft-RUQ remains tender. Extremities:no edema Neurology: Non focal Skin: no rash    Pertinent Labs/Radiology:    Latest Ref Rng & Units 05/28/2021    1:19 AM 05/27/2021    4:00 AM 05/26/2021    4:01 AM  CBC  WBC 4.0 - 10.5 K/uL 5.2   5.7   6.8    Hemoglobin 12.0 - 15.0 g/dL 16.1   09.6   04.5    Hematocrit 36.0 - 46.0 % 39.9   39.7   38.3    Platelets 150 - 400 K/uL 158   155   142      Lab Results  Component Value Date   NA 135 05/31/2021   K 4.1 05/31/2021   CL 103 05/31/2021   CO2 25 05/31/2021     Assessment/Plan: RUQ pain: Unclear etiology-work-up as above-no longer on antibiotics as HIDA scan negative.  Suspicion that she may have drug-induced hepatitis-possibly from Nexplanon implant.  GI discussing of possible biopsy/empiric steroid use with patient/family.  She still has significant pain requiring IV Dilaudid-we will start some oral oxycodone to see if pain can be managed with oral narcotics-if so-we can contemplate discharge over the next few days.  Continue to monitor LFTs-thankfully they still are trending down.  Anxiety/depression: Zoloft/trazodone/Adderall resumed.  BMI: Estimated body mass index is 21.48 kg/m as calculated from the following:   Height as of this encounter: 5' (1.524 m).   Weight as of this encounter: 49.9 kg.   Code status:   Code Status: Full Code   DVT Prophylaxis: enoxaparin (LOVENOX) injection 40 mg Start: 05/28/21 1145 SCDs Start: 05/25/21 2050   Family Communication: Mother at bedside   Disposition Plan: Status is: Observation The patient will require care spanning > 2 midnights and should be moved to inpatient because: Continues to have severe RUQ pain-requiring IV Dilaudid-starting oral narcotics to see if pain  can be managed with oral regimen.  GI continues to discuss work-up with patient.   Planned Discharge Destination:Home   Diet: Diet Order             DIET SOFT Room service appropriate? Yes; Fluid consistency: Thin  Diet effective now                     Antimicrobial  agents: Anti-infectives (From admission, onward)    Start     Dose/Rate Route Frequency Ordered Stop   05/30/21 2200  metroNIDAZOLE (FLAGYL) tablet 500 mg  Status:  Discontinued        500 mg Oral Every 12 hours 05/30/21 1049 05/31/21 1005   05/30/21 2000  ciprofloxacin (CIPRO) tablet 500 mg  Status:  Discontinued        500 mg Oral 2 times daily 05/30/21 1049 05/31/21 1005   05/26/21 0600  metroNIDAZOLE (FLAGYL) IVPB 500 mg  Status:  Discontinued        500 mg 100 mL/hr over 60 Minutes Intravenous Every 12 hours 05/25/21 1902 05/30/21 1049   05/26/21 0600  ciprofloxacin (CIPRO) IVPB 400 mg  Status:  Discontinued        400 mg 200 mL/hr over 60 Minutes Intravenous Every 12 hours 05/25/21 1903 05/30/21 1049   05/25/21 1730  ciprofloxacin (CIPRO) IVPB 400 mg       See Hyperspace for full Linked Orders Report.   400 mg 200 mL/hr over 60 Minutes Intravenous  Once 05/25/21 1723 05/25/21 2217   05/25/21 1730  metroNIDAZOLE (FLAGYL) IVPB 500 mg       See Hyperspace for full Linked Orders Report.   500 mg 100 mL/hr over 60 Minutes Intravenous  Once 05/25/21 1723 05/25/21 2048        MEDICATIONS: Scheduled Meds:  amphetamine-dextroamphetamine  30 mg Oral q morning   enoxaparin (LOVENOX) injection  40 mg Subcutaneous Q24H   polyethylene glycol  17 g Oral Daily   sertraline  100 mg Oral Daily   traZODone  50 mg Oral QHS   Continuous Infusions:   PRN Meds:.HYDROmorphone (DILAUDID) injection, naLOXone (NARCAN)  injection, ondansetron **OR** ondansetron (ZOFRAN) IV, oxyCODONE   I have personally reviewed following labs and imaging studies  LABORATORY DATA: CBC: Recent Labs  Lab 05/25/21 1150 05/25/21 2002 05/26/21 0401 05/27/21 0400 05/28/21 0119  WBC 9.1 7.7 6.8 5.7 5.2  NEUTROABS  --  2.6  --   --  1.5*  HGB 14.7 12.8 12.9 13.3 13.4  HCT 45.5 39.9 38.3 39.7 39.9  MCV 91.7 93.0 89.3 89.2 89.7  PLT 142* 137* 142* 155 158     Basic Metabolic Panel: Recent Labs  Lab  05/25/21 2002 05/26/21 0401 05/27/21 0400 05/28/21 0119 05/29/21 0119 05/30/21 0034 05/31/21 0228  NA  --  137 136 137 134* 136 135  K  --  4.4 4.3 4.0 4.2 4.1 4.1  CL  --  106 101 104 105 105 103  CO2  --  24 22 24 25 24 25   GLUCOSE  --  99 76 90 101* 99 114*  BUN  --  <5* 7 5* <5* <5* <5*  CREATININE  --  0.65 0.83 0.77 0.76 0.77 0.77  CALCIUM  --  8.9 9.3 9.2 9.0 9.0 9.4  MG 1.6* 1.9 1.7  --   --   --  1.9  PHOS 3.9 4.2  --   --   --   --   --  GFR: Estimated Creatinine Clearance: 80.6 mL/min (by C-G formula based on SCr of 0.77 mg/dL).  Liver Function Tests: Recent Labs  Lab 05/27/21 0400 05/28/21 0119 05/29/21 0119 05/30/21 0034 05/31/21 0228  AST 120* 92* 91* 65* 65*  ALT 221* 182* 151* 118* 100*  ALKPHOS 396* 423* 371* 310* 307*  BILITOT 3.4* 2.9* 1.9* 1.8* 1.8*  PROT 6.2* 6.2* 6.1* 6.1* 6.3*  ALBUMIN 3.1* 3.2* 3.1* 3.2* 3.4*    Recent Labs  Lab 05/25/21 1150  LIPASE 31    Recent Labs  Lab 05/25/21 2002  AMMONIA 30     Coagulation Profile: Recent Labs  Lab 05/25/21 2002 05/26/21 0401  INR 1.0 0.9     Cardiac Enzymes: Recent Labs  Lab 05/25/21 2002  CKTOTAL 24*     BNP (last 3 results) No results for input(s): PROBNP in the last 8760 hours.  Lipid Profile: No results for input(s): CHOL, HDL, LDLCALC, TRIG, CHOLHDL, LDLDIRECT in the last 72 hours.  Thyroid Function Tests: No results for input(s): TSH, T4TOTAL, FREET4, T3FREE, THYROIDAB in the last 72 hours.   Anemia Panel: Recent Labs    05/31/21 0228  FERRITIN 65  TIBC 493*  IRON 76    Urine analysis:    Component Value Date/Time   COLORURINE AMBER (A) 05/25/2021 1143   APPEARANCEUR CLEAR 05/25/2021 1143   LABSPEC 1.015 05/25/2021 1143   PHURINE 5.0 05/25/2021 1143   GLUCOSEU NEGATIVE 05/25/2021 1143   HGBUR NEGATIVE 05/25/2021 1143   BILIRUBINUR SMALL (A) 05/25/2021 1143   KETONESUR NEGATIVE 05/25/2021 1143   PROTEINUR NEGATIVE 05/25/2021 1143   NITRITE  NEGATIVE 05/25/2021 1143   LEUKOCYTESUR NEGATIVE 05/25/2021 1143    Sepsis Labs: Lactic Acid, Venous No results found for: LATICACIDVEN  MICROBIOLOGY: Recent Results (from the past 240 hour(s))  Blood culture (routine x 2)     Status: None   Collection Time: 05/25/21  5:24 PM   Specimen: BLOOD LEFT ARM  Result Value Ref Range Status   Specimen Description BLOOD LEFT ARM  Final   Special Requests   Final    BOTTLES DRAWN AEROBIC AND ANAEROBIC Blood Culture results may not be optimal due to an inadequate volume of blood received in culture bottles   Culture   Final    NO GROWTH 5 DAYS Performed at [facility], [address] [place], [place] [address]    Report Status 05/30/2021 FINAL  Final  Blood culture (routine x 2)     Status: None   Collection Time: 05/25/21  5:44 PM   Specimen: BLOOD RIGHT HAND  Result Value Ref Range Status   Specimen Description BLOOD RIGHT HAND  Final   Special Requests   Final    BOTTLES DRAWN AEROBIC AND ANAEROBIC Blood Culture adequate volume   Culture   Final    NO GROWTH 5 DAYS Performed at [facility], [address] [place], [place] [address]    Report Status 05/30/2021 FINAL  Final  SARS Coronavirus 2 by RT PCR (hospital order, performed in [facility] hospital lab) *cepheid single result test* Anterior Nasal Swab     Status: None   Collection Time: 05/25/21  6:46 PM   Specimen: Anterior Nasal Swab  Result Value Ref Range Status   SARS Coronavirus 2 by RT PCR NEGATIVE NEGATIVE Final    Comment: (NOTE) SARS-CoV-2 target nucleic acids are NOT DETECTED.  The SARS-CoV-2 RNA is generally detectable in upper and lower respiratory specimens during the acute phase  of infection. The lowest concentration of SARS-CoV-2 viral copies this assay can detect is 250 copies / mL. A negative result does not preclude SARS-CoV-2 infection and should not be used as the sole basis for treatment or other patient management decisions.  A negative  result may occur with improper specimen collection / handling, submission of specimen other than nasopharyngeal swab, presence of viral mutation(s) within the areas targeted by this assay, and inadequate number of viral copies (<250 copies / mL). A negative result must be combined with clinical observations, patient history, and epidemiological information.  Fact Sheet for Patients:   RoadLapTop.co.za  Fact Sheet for Healthcare Providers: http://kim-miller.com/  This test is not yet approved or  cleared by the [place] FDA and has been authorized for detection and/or diagnosis of SARS-CoV-2 by FDA under an Emergency Use Authorization (EUA).  This EUA will remain in effect (meaning this test can be used) for the duration of the COVID-19 declaration under Section 564(b)(1) of the Act, 21 U.S.C. section 360bbb-3(b)(1), unless the authorization is terminated or revoked sooner.  Performed at [facility], [address], [place], [place] [address]   Respiratory (~20 pathogens) panel by PCR     Status: None   Collection Time: 05/25/21  7:16 PM   Specimen: Nasopharyngeal Swab; Respiratory  Result Value Ref Range Status   Adenovirus NOT DETECTED NOT DETECTED Final   Coronavirus 229E NOT DETECTED NOT DETECTED Final    Comment: (NOTE) The Coronavirus on the Respiratory Panel, DOES NOT test for the novel  Coronavirus (2019 nCoV)    Coronavirus HKU1 NOT DETECTED NOT DETECTED Final   Coronavirus NL63 NOT DETECTED NOT DETECTED Final   Coronavirus OC43 NOT DETECTED NOT DETECTED Final   Metapneumovirus NOT DETECTED NOT DETECTED Final   Rhinovirus / Enterovirus NOT DETECTED NOT DETECTED Final   Influenza A NOT DETECTED NOT DETECTED Final   Influenza B NOT DETECTED NOT DETECTED Final   Parainfluenza Virus 1 NOT DETECTED NOT DETECTED Final   Parainfluenza Virus 2 NOT DETECTED NOT DETECTED Final   Parainfluenza Virus 3 NOT DETECTED NOT  DETECTED Final   Parainfluenza Virus 4 NOT DETECTED NOT DETECTED Final   Respiratory Syncytial Virus NOT DETECTED NOT DETECTED Final   Bordetella pertussis NOT DETECTED NOT DETECTED Final   Bordetella Parapertussis NOT DETECTED NOT DETECTED Final   Chlamydophila pneumoniae NOT DETECTED NOT DETECTED Final   Mycoplasma pneumoniae NOT DETECTED NOT DETECTED Final    Comment: Performed at [facility], [address] [place], [place] [address]    RADIOLOGY STUDIES/RESULTS: NM Hepato W/EF  Result Date: 05/30/2021 CLINICAL DATA:  Right upper quadrant pain, nausea and vomiting EXAM: NUCLEAR MEDICINE HEPATOBILIARY IMAGING WITH GALLBLADDER EF TECHNIQUE: Sequential images of the abdomen were obtained out to 60 minutes following intravenous administration of radiopharmaceutical. After oral ingestion of Ensure, gallbladder ejection fraction was determined. At 60 min, normal ejection fraction is greater than 33%. RADIOPHARMACEUTICALS:  5.0 mCi Tc-71m  Choletec IV COMPARISON:  MRI 05/26/2021, ultrasound 05/25/2021 FINDINGS: Prompt uptake and biliary excretion of activity by the liver is seen. Gallbladder activity is visualized, consistent with patency of cystic duct. Biliary activity passes into small bowel, consistent with patent common bile duct. Calculated gallbladder ejection fraction is 81%. (Normal gallbladder ejection fraction with Ensure is greater than 33%.) IMPRESSION: 1. Normal hepatobiliary scan.  Normal ejection fraction. Electronically Signed   By: Sharlet Salina M.D.   On: 05/30/2021 15:43     LOS: 5 days  Jeoffrey Massed, MD  [facility]    To contact the attending provider between 7A-7P or the covering provider during after hours 7P-7A, please log into the web site [email or web address] and access using universal [place] password for that web site. If you do not have the password, please call the hospital operator.  05/31/2021, 11:04 AM

## 2021-05-31 NOTE — Progress Notes (Addendum)
[facility] Progress Note  RUDI GRANDBERRY 21 y.o. March 13, 2000  CC: Right upper quadrant abdominal pain, abnormal LFTs   Subjective: Patient seen and examined at bedside.  Resting comfortably in the bed.  Mother at bedside.  Continues to have intermittent abdominal pain requiring IV pain medicine.  ROS : Afebrile.  Negative for chest pain.   Objective: Vital signs in last 24 hours: Vitals:   05/31/21 0431 05/31/21 0729  BP: 125/67 103/60  Pulse: 98 83  Resp:  18  Temp: 97.9 F (36.6 C) 97.9 F (36.6 C)  SpO2: 96% 96%    Physical Exam:  General:  Alert, cooperative, no distress, appears stated age  Head:  Normocephalic, without obvious abnormality, atraumatic  Eyes:  , EOM's intact,   Lungs:   Clear to auscultation bilaterally, respirations unlabored  Heart:  Regular rate and rhythm, S1, S2 normal  Abdomen:   Soft, right upper quadrant tenderness to palpation, abdomen is soft, bowel sounds present.  No peritoneal signs  Extremities: Extremities normal, atraumatic, no  edema  Pulses: 2+ and symmetric    Lab Results: Recent Labs    05/30/21 0034 05/31/21 0228  NA 136 135  K 4.1 4.1  CL 105 103  CO2 24 25  GLUCOSE 99 114*  BUN <5* <5*  CREATININE 0.77 0.77  CALCIUM 9.0 9.4  MG  --  1.9   Recent Labs    05/30/21 0034 05/31/21 0228  AST 65* 65*  ALT 118* 100*  ALKPHOS 310* 307*  BILITOT 1.8* 1.8*  PROT 6.1* 6.3*  ALBUMIN 3.2* 3.4*   No results for input(s): WBC, NEUTROABS, HGB, HCT, MCV, PLT in the last 72 hours.  No results for input(s): LABPROT, INR in the last 72 hours.    Assessment/Plan: -Right upper quadrant abdominal pain with abnormal LFTs.  Initial CT scan showed gallbladder wall thickening concerning for cholecystitis.  Follow-up ultrasound also showed gallbladder thickening with pericholecystic fluid and positive Murphy sign.  MRI MRCP with contrast on May 26, 2021 showed distended gallbladder with some pericholecystic fluid  along with hepatomegaly and periportal edema.  Could be hepatocellular process versus cholecystitis.  HIDA scan with EF normal yesterday.  -Constipation.    Recommendations --------------------------- -Patient with right upper quadrant abdominal pain and abnormal LFTs after placement of Nexplanon implant. its been associated with abdominal pain and high dose of progestin also been associated with abnormal LFTs.  -Different options discussed with the patient and patient's mother.  - Option for liver biopsy, starting low-dose prednisone versus conservative management with repeat LFTs in 1 week as an outpatient and considering liver biopsy if they remain elevated discussed.  -Patient wants to think about it.  Discussed with Dr. Sloan Leiter.  She may likely stay in hospital for another day for pain control.  -DC antibiotic  -GI will follow if patient remains in hospital.   Otis Brace MD, [place] 05/31/2021, 10:00 AM  Contact #  [phone]

## 2021-05-31 NOTE — Discharge Summary (Addendum)
PATIENT DETAILS Name: Bethany Rose Age: 21 y.o. Sex: female Date of Birth: June 24, 2000 MRN: [id number]. Admitting Physician: Maretta Bees, MD PCP:[facility], [facility]  Admit Date: 05/25/2021 Discharge date: 05/31/2021  Recommendations for Outpatient Follow-up:  Follow up with PCP in 1-2 weeks Please obtain CMP/CBC in one week Please ensure follow up  with [facility] Ceruloplasmin levels pending-please follow  Admitted From:  Home  Disposition: Home   Discharge Condition: good  CODE STATUS:   Code Status: Full Code   Diet recommendation:  Diet Order             Diet general           DIET SOFT Room service appropriate? Yes; Fluid consistency: Thin  Diet effective now                    Brief Summary: Patient is a 21 y.o.  female with no past medical history-presenting with 1 week history of mostly RUQ pain and intermittent nausea/vomiting.  She was found to have acute cholecystitis and obstructive jaundice. She was recently seen by her OBGYN who removed her birth implant given her ongoing issues. She was subsequently admitted to [facility] for further evaluation and treatment.     Significant events: 5/25>> admit to [facility]-acute cholecystitis with obstructive jaundice.   Significant studies: 5/25>> CT abdomen/pelvis: Hepatosplenomegaly-no biliary ductal dilatation.  Gallbladder wall thickening with possible cholelithiasis/sludge. 5/25>> RUQ ultrasound: Gallbladder thickening with pericholecystic fluid and positive sonographic Murphy sign. 5/26>> MRI abdomen: Hepatomegaly with periportal edema-possible hepatitis-distended gallbladder/pericholecystic fluid.  No biliary ductal dilatation or choledocholithiasis.  Has splenomegaly. 5/26>> ANA: Negative 5/26>> AMA/ASMA Ab: Negative 5/30>> HIDA scan: Patent cystic duct-normal EF 5/31>> ceruloplasmin level: Pending   Significant microbiology data: 5/25>>  COVID PCR: Negative 5/25>> blood culture: No  growth 5/25>> hepatitis A/B/C serology: Negative     Procedures: None   Consults: GI, general surgery  Brief Hospital Course: RUQ pain: Unclear etiology-initially was thought to have acalculous cholecystitis-but upon further imaging/surgical evaluation, this was felt to be unlikely (see work up above). Acute hepatitis serology-and serology for autoimmune hepatitis was negative as well. Suspicion that she may have drug-induced hepatitis-possibly from Nexplanon implant.  Thankfully her LFT's improved with just supportive care. Her RUQ also slowly improved, and by day of discharge was controlled with just oral oxycodone. GI briefly d/w patient/family regarding empiric prednisone and possible liver bx-however patient prefers to wait at this point. Since clinically improved-patient is being discharged home at her own request, [facility] will arrange for outpatient follow up with repeat LFT in a few weeks-and possible empiric prednisone use/liver biopsy if her LFT's are still elevated then.Patient aware that he is avoid her tylenol/oral contraceptive pills until her liver issues have improved further, and to only resume these meds once she has talk it over with her PCP or GI MD. She is aware that before she starts any OTC/Herbal supplement or anh new prescription medications-she needs to talk with her PCP/GI MD first.She was encouraged to use other/non hormonal means for birth control for the time being.   Anxiety/depression: Zoloft/trazodone/Adderall resumed.   BMI: Estimated body mass index is 21.48 kg/m as calculated from the following:   Height as of this encounter: 5' (1.524 m).   Weight as of this encounter: 49.9 kg.  Discharge Diagnoses:  Principal Problem:   Transaminitis Active Problems:   Hypomagnesemia   Discharge Instructions:  Activity:  As tolerated   Discharge Instructions  Call MD for:  persistant nausea and vomiting   Complete by: As directed    Call MD for:  severe  uncontrolled pain   Complete by: As directed    Diet general   Complete by: As directed    Discharge instructions   Complete by: As directed    Follow with Primary MD  [facility] in 1-2 weeks  Avoid Tylenol-birth control pills-until your liver function completely recovers  While you are recovering from liver injury-please talk to your primary care practitioner or your gastroenterologist before you take any over the counter or herbal supplements. Please talk to your primary care practitioner or your gastroenterologist before you take any new prescription medication as well.  [facility] will call you with a follow up appointment-if you do not hear from them in a few days, please give them a call.  Please get a complete blood count and chemistry panel checked by your Primary MD at your next visit, and again as instructed by your Primary MD.  Get Medicines reviewed and adjusted: Please take all your medications with you for your next visit with your Primary MD  Laboratory/radiological data: Please request your Primary MD to go over all hospital tests and procedure/radiological results at the follow up, please ask your Primary MD to get all Hospital records sent to his/her office.  In some cases, they will be blood work, cultures and biopsy results pending at the time of your discharge. Please request that your primary care M.D. follows up on these results.  Also Note the following: If you experience worsening of your admission symptoms, develop shortness of breath, Rose threatening emergency, suicidal or homicidal thoughts you must seek medical attention immediately by calling 911 or calling your MD immediately  if symptoms less severe.  You must read complete instructions/literature along with all the possible adverse reactions/side effects for all the Medicines you take and that have been prescribed to you. Take any new Medicines after you have completely  understood and accpet all the possible adverse reactions/side effects.   Do not drive when taking Pain medications or sleeping medications (Benzodaizepines)  Do not take more than prescribed Pain, Sleep and Anxiety Medications. It is not advisable to combine anxiety,sleep and pain medications without talking with your primary care practitioner  Special Instructions: If you have smoked or chewed Tobacco  in the last 2 yrs please stop smoking, stop any regular Alcohol  and or any Recreational drug use.  Wear Seat belts while driving.  Please note: You were cared for by a hospitalist during your hospital stay. Once you are discharged, your primary care physician will handle any further medical issues. Please note that NO REFILLS for any discharge medications will be authorized once you are discharged, as it is imperative that you return to your primary care physician (or establish a relationship with a primary care physician if you do not have one) for your post hospital discharge needs so that they can reassess your need for medications and monitor your lab values.   Increase activity slowly   Complete by: As directed       Allergies as of 05/31/2021       Reactions   Amoxicillin Hives   Throat close   Penicillins Hives   Throat close        Medication List     STOP taking these medications    Junel FE 1/20 1-20 MG-MCG tablet Generic drug: norethindrone-ethinyl estradiol-FE  TAKE these medications    amphetamine-dextroamphetamine 30 MG 24 hr capsule Commonly known as: ADDERALL XR Take 30 mg by mouth every morning.   cetirizine 10 MG tablet Commonly known as: ZYRTEC Take 10 mg by mouth daily.   fluticasone 50 MCG/ACT nasal spray Commonly known as: FLONASE Place 2 sprays into the nose daily.   montelukast 10 MG tablet Commonly known as: SINGULAIR Take 10 mg by mouth daily.   oxyCODONE 5 MG immediate release tablet Commonly known as: Oxy IR/ROXICODONE Take 1  tablet (5 mg total) by mouth every 6 (six) hours as needed for moderate pain.   polyethylene glycol powder 17 GM/SCOOP powder Commonly known as: GLYCOLAX/MIRALAX Take 17 g by mouth daily. Start taking on: June 01, 2021   sertraline 100 MG tablet Commonly known as: ZOLOFT Take 100 mg by mouth daily.   traZODone 50 MG tablet Commonly known as: DESYREL Take 50 mg by mouth at bedtime.        Follow-up Information     [facility]. Schedule an appointment as soon as possible for a visit in 1 week(s).   Specialty: Family Medicine Contact information: [address] [place] [address] [phone]         [facility], [facility] Follow up.   Why: Office will call with date/time, If you dont hear from them,please give them a call, Repeat Liver Function Tests Contact information: [address] [place] [address] [phone]                Allergies  Allergen Reactions   Amoxicillin Hives    Throat close   Penicillins Hives    Throat close     Other Procedures/Studies: CT ABDOMEN PELVIS W CONTRAST  Result Date: 05/25/2021 CLINICAL DATA:  Transaminitis, abdominal pain EXAM: CT ABDOMEN AND PELVIS WITH CONTRAST TECHNIQUE: Multidetector CT imaging of the abdomen and pelvis was performed using the standard protocol following bolus administration of intravenous contrast. RADIATION DOSE REDUCTION: This exam was performed according to the departmental dose-optimization program which includes automated exposure control, adjustment of the mA and/or kV according to patient size and/or use of iterative reconstruction technique. CONTRAST:  70mL OMNIPAQUE IOHEXOL 300 MG/ML  SOLN COMPARISON:  None Available. FINDINGS: Lower chest: No acute abnormality. Hepatobiliary: Liver is enlarged measuring 18.4 cm in length. No focal hepatic mass identified. Gallbladder is normal size and appears to contain mild increased densities,  possibly cholelithiasis or sludge. There is diffuse gallbladder wall thickening. No biliary ductal dilatation. Pancreas: Unremarkable. No pancreatic ductal dilatation or surrounding inflammatory changes. Spleen: Enlarged measuring 13.5 cm in length. Adrenals/Urinary Tract: Adrenal glands are unremarkable. Kidneys are normal, without renal calculi, focal lesion, or hydronephrosis. Bladder is unremarkable. Stomach/Bowel: No bowel obstruction, free air or pneumatosis. No bowel wall edema identified. Moderate amount of retained fecal material in the colon. Appendix is normal. Vascular/Lymphatic: No significant vascular findings are present. No enlarged abdominal or pelvic lymph nodes. Reproductive: Diffuse prominent myometrial vascularity as well as prominent left adnexal vascularity and ovarian vein which measures up to 7 mm in diameter. No suspicious adnexal mass identified. Other: No ascites. Musculoskeletal: No acute or significant osseous findings. IMPRESSION: 1. Hepatosplenomegaly.  No biliary ductal dilatation. 2. Gallbladder wall thickening and possible cholelithiasis or sludge. Consider follow-up right upper quadrant ultrasound. 3. Prominent myometrial and left adnexal vascularity which is nonspecific and can be seen with pelvic congestion syndrome, correlate clinically. Electronically Signed   By: Philis Kendall.D.  On: 05/25/2021 15:15   MR 3D Recon At Scanner  Result Date: 05/26/2021 CLINICAL DATA:  One week history of right upper quadrant pain and intermittent nausea and vomiting. Acute cholecystitis and obstructive jaundice. EXAM: MRI ABDOMEN WITHOUT AND WITH CONTRAST (INCLUDING MRCP) TECHNIQUE: Multiplanar multisequence MR imaging of the abdomen was performed both before and after the administration of intravenous contrast. Heavily T2-weighted images of the biliary and pancreatic ducts were obtained, and three-dimensional MRCP images were rendered by post processing. CONTRAST:  4.50mL GADAVIST  GADOBUTROL 1 MMOL/ML IV SOLN COMPARISON:  Right upper quadrant ultrasound and CT abdomen pelvis dated May 25, 2021 FINDINGS: Lower chest: Heterogeneous signal in the lung bases likely reflects atelectasis. Hepatobiliary: No significant hepatic steatosis. No suspicious hepatic lesion. Hepatomegaly measuring 19.9 cm in maximum craniocaudal dimension at the midclavicular line. Periportal edema. Distended gallbladder with some pericholecystic fluid. No biliary ductal dilation or overt peribiliary enhancement. Pancreas: Intrinsic T1 signal of the pancreatic parenchyma is within normal limits. No pancreatic ductal dilation. Homogeneous postcontrast enhancement of the pancreas. Spleen:  Splenomegaly measuring 14.6 cm in craniocaudal dimension. Adrenals/Urinary Tract: Bilateral adrenal glands appear normal. No hydronephrosis. No suspicious renal mass. Stomach/Bowel: Visualized portions within the abdomen are unremarkable. Vascular/Lymphatic: No pathologically enlarged lymph nodes identified. No abdominal aortic aneurysm demonstrated. Other:  Trace perihepatic free fluid. Musculoskeletal: No suspicious bone lesions identified. IMPRESSION: 1. Hepatomegaly with periportal edema and trace perihepatic fluid, possibly reflecting hepatitis. 2. Distended gallbladder with some pericholecystic fluid, favored to reflect sequela of a hepatocellular process however acute cholecystitis is not excluded. Consider more definitive assessment for acute cholecystitis with nuclear medicine HIDA scan. 3. No biliary ductal dilation or choledocholithiasis. 4. Splenomegaly. Electronically Signed   By: Maudry Mayhew M.D.   On: 05/26/2021 11:03   NM Hepato W/EF  Result Date: 05/30/2021 CLINICAL DATA:  Right upper quadrant pain, nausea and vomiting EXAM: NUCLEAR MEDICINE HEPATOBILIARY IMAGING WITH GALLBLADDER EF TECHNIQUE: Sequential images of the abdomen were obtained out to 60 minutes following intravenous administration of radiopharmaceutical.  After oral ingestion of Ensure, gallbladder ejection fraction was determined. At 60 min, normal ejection fraction is greater than 33%. RADIOPHARMACEUTICALS:  5.0 mCi Tc-10m  Choletec IV COMPARISON:  MRI 05/26/2021, ultrasound 05/25/2021 FINDINGS: Prompt uptake and biliary excretion of activity by the liver is seen. Gallbladder activity is visualized, consistent with patency of cystic duct. Biliary activity passes into small bowel, consistent with patent common bile duct. Calculated gallbladder ejection fraction is 81%. (Normal gallbladder ejection fraction with Ensure is greater than 33%.) IMPRESSION: 1. Normal hepatobiliary scan.  Normal ejection fraction. Electronically Signed   By: Sharlet Salina M.D.   On: 05/30/2021 15:43   US Abdomen Limited  Result Date: 05/25/2021 CLINICAL DATA:  Transaminitis. EXAM: ULTRASOUND ABDOMEN LIMITED RIGHT UPPER QUADRANT COMPARISON:  CT abdomen and pelvis 05/25/2021. FINDINGS: Gallbladder: No gallstones are identified. There is gallbladder wall thickening measuring up to 4 mm with mild pericholecystic fluid. Sonographic Eulah Pont sign is positive per sonographer. Common bile duct: Diameter: 1.9 mm. Liver: No focal lesion identified. Within normal limits in parenchymal echogenicity. Portal vein is patent on color Doppler imaging with normal direction of blood flow towards the liver. Other: None. IMPRESSION: 1. No gallstones. Gallbladder wall thickening, pericholecystic fluid and positive sonographic Murphy sign. Findings are concerning for acute acalculous cholecystitis. Electronically Signed   By: Darliss Cheney M.D.   On: 05/25/2021 17:10   MR ABDOMEN MRCP W WO CONTAST  Result Date: 05/26/2021 CLINICAL DATA:  One week history of right upper  quadrant pain and intermittent nausea and vomiting. Acute cholecystitis and obstructive jaundice. EXAM: MRI ABDOMEN WITHOUT AND WITH CONTRAST (INCLUDING MRCP) TECHNIQUE: Multiplanar multisequence MR imaging of the abdomen was performed both  before and after the administration of intravenous contrast. Heavily T2-weighted images of the biliary and pancreatic ducts were obtained, and three-dimensional MRCP images were rendered by post processing. CONTRAST:  4.43mL GADAVIST GADOBUTROL 1 MMOL/ML IV SOLN COMPARISON:  Right upper quadrant ultrasound and CT abdomen pelvis dated May 25, 2021 FINDINGS: Lower chest: Heterogeneous signal in the lung bases likely reflects atelectasis. Hepatobiliary: No significant hepatic steatosis. No suspicious hepatic lesion. Hepatomegaly measuring 19.9 cm in maximum craniocaudal dimension at the midclavicular line. Periportal edema. Distended gallbladder with some pericholecystic fluid. No biliary ductal dilation or overt peribiliary enhancement. Pancreas: Intrinsic T1 signal of the pancreatic parenchyma is within normal limits. No pancreatic ductal dilation. Homogeneous postcontrast enhancement of the pancreas. Spleen:  Splenomegaly measuring 14.6 cm in craniocaudal dimension. Adrenals/Urinary Tract: Bilateral adrenal glands appear normal. No hydronephrosis. No suspicious renal mass. Stomach/Bowel: Visualized portions within the abdomen are unremarkable. Vascular/Lymphatic: No pathologically enlarged lymph nodes identified. No abdominal aortic aneurysm demonstrated. Other:  Trace perihepatic free fluid. Musculoskeletal: No suspicious bone lesions identified. IMPRESSION: 1. Hepatomegaly with periportal edema and trace perihepatic fluid, possibly reflecting hepatitis. 2. Distended gallbladder with some pericholecystic fluid, favored to reflect sequela of a hepatocellular process however acute cholecystitis is not excluded. Consider more definitive assessment for acute cholecystitis with nuclear medicine HIDA scan. 3. No biliary ductal dilation or choledocholithiasis. 4. Splenomegaly. Electronically Signed   By: Maudry Mayhew M.D.   On: 05/26/2021 11:03     TODAY-DAY OF DISCHARGE:  Subjective:   Bethany Rose today has  no headache,no chest abdominal pain,no new weakness tingling or numbness, feels much better wants to go home today.   Objective:   Blood pressure 108/66, pulse 86, temperature 97.8 F (36.6 C), temperature source Oral, resp. rate 18, height 5' (1.524 m), weight 49.9 kg, last menstrual period 05/20/2021, SpO2 97 %. No intake or output data in the 24 hours ending 05/31/21 1532 Filed Weights   05/25/21 1140  Weight: 49.9 kg    Exam: Awake Alert, Oriented *3, No new F.N deficits, Normal affect [place].AT,PERRAL Supple Neck,No JVD, No cervical lymphadenopathy appriciated.  Symmetrical Chest wall movement, Good air movement bilaterally, CTAB RRR,No Gallops,Rubs or new Murmurs, No Parasternal Heave +ve B.Sounds, Abd Soft, Non tender, No organomegaly appriciated, No rebound -guarding or rigidity. No Cyanosis, Clubbing or edema, No new Rash or bruise   PERTINENT RADIOLOGIC STUDIES: NM Hepato W/EF  Result Date: 05/30/2021 CLINICAL DATA:  Right upper quadrant pain, nausea and vomiting EXAM: NUCLEAR MEDICINE HEPATOBILIARY IMAGING WITH GALLBLADDER EF TECHNIQUE: Sequential images of the abdomen were obtained out to 60 minutes following intravenous administration of radiopharmaceutical. After oral ingestion of Ensure, gallbladder ejection fraction was determined. At 60 min, normal ejection fraction is greater than 33%. RADIOPHARMACEUTICALS:  5.0 mCi Tc-32m  Choletec IV COMPARISON:  MRI 05/26/2021, ultrasound 05/25/2021 FINDINGS: Prompt uptake and biliary excretion of activity by the liver is seen. Gallbladder activity is visualized, consistent with patency of cystic duct. Biliary activity passes into small bowel, consistent with patent common bile duct. Calculated gallbladder ejection fraction is 81%. (Normal gallbladder ejection fraction with Ensure is greater than 33%.) IMPRESSION: 1. Normal hepatobiliary scan.  Normal ejection fraction. Electronically Signed   By: Sharlet Salina M.D.   On: 05/30/2021 15:43      PERTINENT LAB RESULTS: CBC: No results for  input(s): WBC, HGB, HCT, PLT in the last 72 hours. CMET CMP     Component Value Date/Time   NA 135 05/31/2021 0228   K 4.1 05/31/2021 0228   CL 103 05/31/2021 0228   CO2 25 05/31/2021 0228   GLUCOSE 114 (H) 05/31/2021 0228   BUN <5 (L) 05/31/2021 0228   CREATININE 0.77 05/31/2021 0228   CALCIUM 9.4 05/31/2021 0228   PROT 6.3 (L) 05/31/2021 0228   ALBUMIN 3.4 (L) 05/31/2021 0228   AST 65 (H) 05/31/2021 0228   ALT 100 (H) 05/31/2021 0228   ALKPHOS 307 (H) 05/31/2021 0228   BILITOT 1.8 (H) 05/31/2021 0228   GFRNONAA >60 05/31/2021 0228    GFR Estimated Creatinine Clearance: 80.6 mL/min (by C-G formula based on SCr of 0.77 mg/dL). No results for input(s): LIPASE, AMYLASE in the last 72 hours. No results for input(s): CKTOTAL, CKMB, CKMBINDEX, TROPONINI in the last 72 hours. Invalid input(s): POCBNP No results for input(s): DDIMER in the last 72 hours. No results for input(s): HGBA1C in the last 72 hours. No results for input(s): CHOL, HDL, LDLCALC, TRIG, CHOLHDL, LDLDIRECT in the last 72 hours. No results for input(s): TSH, T4TOTAL, T3FREE, THYROIDAB in the last 72 hours.  Invalid input(s): FREET3 Recent Labs    05/31/21 0228  FERRITIN 65  TIBC 493*  IRON 76   Coags: No results for input(s): INR in the last 72 hours.  Invalid input(s): PT Microbiology: Recent Results (from the past 240 hour(s))  Blood culture (routine x 2)     Status: None   Collection Time: 05/25/21  5:24 PM   Specimen: BLOOD LEFT ARM  Result Value Ref Range Status   Specimen Description BLOOD LEFT ARM  Final   Special Requests   Final    BOTTLES DRAWN AEROBIC AND ANAEROBIC Blood Culture results may not be optimal due to an inadequate volume of blood received in culture bottles   Culture   Final    NO GROWTH 5 DAYS Performed at [facility], [address] [place], [place] [address]    Report Status 05/30/2021 FINAL  Final  Blood culture  (routine x 2)     Status: None   Collection Time: 05/25/21  5:44 PM   Specimen: BLOOD RIGHT HAND  Result Value Ref Range Status   Specimen Description BLOOD RIGHT HAND  Final   Special Requests   Final    BOTTLES DRAWN AEROBIC AND ANAEROBIC Blood Culture adequate volume   Culture   Final    NO GROWTH 5 DAYS Performed at [facility], [address] [place], [place] [address]    Report Status 05/30/2021 FINAL  Final  SARS Coronavirus 2 by RT PCR (hospital order, performed in [facility] hospital lab) *cepheid single result test* Anterior Nasal Swab     Status: None   Collection Time: 05/25/21  6:46 PM   Specimen: Anterior Nasal Swab  Result Value Ref Range Status   SARS Coronavirus 2 by RT PCR NEGATIVE NEGATIVE Final    Comment: (NOTE) SARS-CoV-2 target nucleic acids are NOT DETECTED.  The SARS-CoV-2 RNA is generally detectable in upper and lower respiratory specimens during the acute phase of infection. The lowest concentration of SARS-CoV-2 viral copies this assay can detect is 250 copies / mL. A negative result does not preclude SARS-CoV-2 infection and should not be used as the sole basis for treatment or other patient management decisions.  A negative result may occur with improper specimen collection /  handling, submission of specimen other than nasopharyngeal swab, presence of viral mutation(s) within the areas targeted by this assay, and inadequate number of viral copies (<250 copies / mL). A negative result must be combined with clinical observations, patient history, and epidemiological information.  Fact Sheet for Patients:   RoadLapTop.co.za  Fact Sheet for Healthcare Providers: http://kim-miller.com/  This test is not yet approved or  cleared by the [place] FDA and has been authorized for detection and/or diagnosis of SARS-CoV-2 by FDA under an Emergency Use Authorization (EUA).  This EUA will remain in  effect (meaning this test can be used) for the duration of the COVID-19 declaration under Section 564(b)(1) of the Act, 21 U.S.C. section 360bbb-3(b)(1), unless the authorization is terminated or revoked sooner.  Performed at [facility], [address] [place], [place] [address]   Respiratory (~20 pathogens) panel by PCR     Status: None   Collection Time: 05/25/21  7:16 PM   Specimen: Nasopharyngeal Swab; Respiratory  Result Value Ref Range Status   Adenovirus NOT DETECTED NOT DETECTED Final   Coronavirus 229E NOT DETECTED NOT DETECTED Final    Comment: (NOTE) The Coronavirus on the Respiratory Panel, DOES NOT test for the novel  Coronavirus (2019 nCoV)    Coronavirus HKU1 NOT DETECTED NOT DETECTED Final   Coronavirus NL63 NOT DETECTED NOT DETECTED Final   Coronavirus OC43 NOT DETECTED NOT DETECTED Final   Metapneumovirus NOT DETECTED NOT DETECTED Final   Rhinovirus / Enterovirus NOT DETECTED NOT DETECTED Final   Influenza A NOT DETECTED NOT DETECTED Final   Influenza B NOT DETECTED NOT DETECTED Final   Parainfluenza Virus 1 NOT DETECTED NOT DETECTED Final   Parainfluenza Virus 2 NOT DETECTED NOT DETECTED Final   Parainfluenza Virus 3 NOT DETECTED NOT DETECTED Final   Parainfluenza Virus 4 NOT DETECTED NOT DETECTED Final   Respiratory Syncytial Virus NOT DETECTED NOT DETECTED Final   Bordetella pertussis NOT DETECTED NOT DETECTED Final   Bordetella Parapertussis NOT DETECTED NOT DETECTED Final   Chlamydophila pneumoniae NOT DETECTED NOT DETECTED Final   Mycoplasma pneumoniae NOT DETECTED NOT DETECTED Final    Comment: Performed at [facility], [address], [place], [place] [address]    FURTHER DISCHARGE INSTRUCTIONS:  Get Medicines reviewed and adjusted: Please take all your medications with you for your next visit with your Primary MD  Laboratory/radiological data: Please request your Primary MD to go over all hospital tests and procedure/radiological  results at the follow up, please ask your Primary MD to get all Hospital records sent to his/her office.  In some cases, they will be blood work, cultures and biopsy results pending at the time of your discharge. Please request that your primary care M.D. goes through all the records of your hospital data and follows up on these results.  Also Note the following: If you experience worsening of your admission symptoms, develop shortness of breath, Rose threatening emergency, suicidal or homicidal thoughts you must seek medical attention immediately by calling 911 or calling your MD immediately  if symptoms less severe.  You must read complete instructions/literature along with all the possible adverse reactions/side effects for all the Medicines you take and that have been prescribed to you. Take any new Medicines after you have completely understood and accpet all the possible adverse reactions/side effects.   Do not drive when taking Pain medications or sleeping medications (Benzodaizepines)  Do not take more than prescribed Pain, Sleep and Anxiety Medications.  It is not advisable to combine anxiety,sleep and pain medications without talking with your primary care practitioner  Special Instructions: If you have smoked or chewed Tobacco  in the last 2 yrs please stop smoking, stop any regular Alcohol  and or any Recreational drug use.  Wear Seat belts while driving.  Please note: You were cared for by a hospitalist during your hospital stay. Once you are discharged, your primary care physician will handle any further medical issues. Please note that NO REFILLS for any discharge medications will be authorized once you are discharged, as it is imperative that you return to your primary care physician (or establish a relationship with a primary care physician if you do not have one) for your post hospital discharge needs so that they can reassess your need for medications and monitor your lab  values.  Total Time spent coordinating discharge including counseling, education and face to face time equals greater than 30 minutes.  SignedJeoffrey Massed 05/31/2021 3:32 PM

## 2021-06-01 LAB — CERULOPLASMIN: Ceruloplasmin: 35.4 mg/dL (ref 19.0–39.0)

## 2021-06-05 DIAGNOSIS — R945 Abnormal results of liver function studies: Secondary | ICD-10-CM | POA: Diagnosis not present

## 2021-06-05 DIAGNOSIS — R7989 Other specified abnormal findings of blood chemistry: Secondary | ICD-10-CM | POA: Diagnosis not present

## 2021-06-07 DIAGNOSIS — F411 Generalized anxiety disorder: Secondary | ICD-10-CM | POA: Diagnosis not present

## 2021-06-07 DIAGNOSIS — R945 Abnormal results of liver function studies: Secondary | ICD-10-CM | POA: Diagnosis not present

## 2021-06-12 DIAGNOSIS — F411 Generalized anxiety disorder: Secondary | ICD-10-CM | POA: Diagnosis not present

## 2021-06-23 DIAGNOSIS — S060X0A Concussion without loss of consciousness, initial encounter: Secondary | ICD-10-CM | POA: Diagnosis not present

## 2021-06-27 DIAGNOSIS — R945 Abnormal results of liver function studies: Secondary | ICD-10-CM | POA: Diagnosis not present

## 2021-06-30 DIAGNOSIS — F411 Generalized anxiety disorder: Secondary | ICD-10-CM | POA: Diagnosis not present

## 2021-07-11 DIAGNOSIS — F411 Generalized anxiety disorder: Secondary | ICD-10-CM | POA: Diagnosis not present

## 2021-07-19 DIAGNOSIS — F411 Generalized anxiety disorder: Secondary | ICD-10-CM | POA: Diagnosis not present

## 2021-07-26 DIAGNOSIS — F411 Generalized anxiety disorder: Secondary | ICD-10-CM | POA: Diagnosis not present

## 2021-07-31 DIAGNOSIS — F411 Generalized anxiety disorder: Secondary | ICD-10-CM | POA: Diagnosis not present

## 2021-08-03 DIAGNOSIS — F411 Generalized anxiety disorder: Secondary | ICD-10-CM | POA: Diagnosis not present

## 2021-08-04 DIAGNOSIS — Z0000 Encounter for general adult medical examination without abnormal findings: Secondary | ICD-10-CM | POA: Diagnosis not present

## 2021-08-09 DIAGNOSIS — F411 Generalized anxiety disorder: Secondary | ICD-10-CM | POA: Diagnosis not present

## 2021-08-16 DIAGNOSIS — Z01419 Encounter for gynecological examination (general) (routine) without abnormal findings: Secondary | ICD-10-CM | POA: Diagnosis not present

## 2021-08-16 DIAGNOSIS — Z681 Body mass index (BMI) 19 or less, adult: Secondary | ICD-10-CM | POA: Diagnosis not present

## 2021-08-16 DIAGNOSIS — F411 Generalized anxiety disorder: Secondary | ICD-10-CM | POA: Diagnosis not present

## 2021-08-16 DIAGNOSIS — Z0142 Encounter for cervical smear to confirm findings of recent normal smear following initial abnormal smear: Secondary | ICD-10-CM | POA: Diagnosis not present

## 2021-08-22 DIAGNOSIS — F411 Generalized anxiety disorder: Secondary | ICD-10-CM | POA: Diagnosis not present

## 2021-08-29 DIAGNOSIS — R945 Abnormal results of liver function studies: Secondary | ICD-10-CM | POA: Diagnosis not present

## 2021-08-30 DIAGNOSIS — F411 Generalized anxiety disorder: Secondary | ICD-10-CM | POA: Diagnosis not present

## 2021-08-30 DIAGNOSIS — Z30430 Encounter for insertion of intrauterine contraceptive device: Secondary | ICD-10-CM | POA: Diagnosis not present

## 2021-08-30 DIAGNOSIS — Z3202 Encounter for pregnancy test, result negative: Secondary | ICD-10-CM | POA: Diagnosis not present

## 2021-09-12 DIAGNOSIS — F411 Generalized anxiety disorder: Secondary | ICD-10-CM | POA: Diagnosis not present

## 2021-09-19 DIAGNOSIS — F411 Generalized anxiety disorder: Secondary | ICD-10-CM | POA: Diagnosis not present

## 2021-09-26 DIAGNOSIS — F411 Generalized anxiety disorder: Secondary | ICD-10-CM | POA: Diagnosis not present

## 2021-10-03 DIAGNOSIS — F411 Generalized anxiety disorder: Secondary | ICD-10-CM | POA: Diagnosis not present

## 2021-10-10 DIAGNOSIS — F411 Generalized anxiety disorder: Secondary | ICD-10-CM | POA: Diagnosis not present

## 2021-10-12 DIAGNOSIS — Z23 Encounter for immunization: Secondary | ICD-10-CM | POA: Diagnosis not present

## 2021-10-17 DIAGNOSIS — F411 Generalized anxiety disorder: Secondary | ICD-10-CM | POA: Diagnosis not present

## 2021-11-01 DIAGNOSIS — L02422 Furuncle of left axilla: Secondary | ICD-10-CM | POA: Diagnosis not present

## 2021-11-07 DIAGNOSIS — F411 Generalized anxiety disorder: Secondary | ICD-10-CM | POA: Diagnosis not present

## 2021-11-14 DIAGNOSIS — F411 Generalized anxiety disorder: Secondary | ICD-10-CM | POA: Diagnosis not present

## 2021-11-27 DIAGNOSIS — J029 Acute pharyngitis, unspecified: Secondary | ICD-10-CM | POA: Diagnosis not present

## 2021-11-28 DIAGNOSIS — F411 Generalized anxiety disorder: Secondary | ICD-10-CM | POA: Diagnosis not present

## 2022-01-02 DIAGNOSIS — F411 Generalized anxiety disorder: Secondary | ICD-10-CM | POA: Diagnosis not present

## 2022-01-05 DIAGNOSIS — F411 Generalized anxiety disorder: Secondary | ICD-10-CM | POA: Diagnosis not present

## 2022-01-09 DIAGNOSIS — F411 Generalized anxiety disorder: Secondary | ICD-10-CM | POA: Diagnosis not present

## 2022-01-11 DIAGNOSIS — F411 Generalized anxiety disorder: Secondary | ICD-10-CM | POA: Diagnosis not present

## 2022-01-16 DIAGNOSIS — F411 Generalized anxiety disorder: Secondary | ICD-10-CM | POA: Diagnosis not present

## 2022-01-18 DIAGNOSIS — F411 Generalized anxiety disorder: Secondary | ICD-10-CM | POA: Diagnosis not present

## 2022-01-23 DIAGNOSIS — F411 Generalized anxiety disorder: Secondary | ICD-10-CM | POA: Diagnosis not present

## 2022-01-30 DIAGNOSIS — F411 Generalized anxiety disorder: Secondary | ICD-10-CM | POA: Diagnosis not present

## 2022-02-06 DIAGNOSIS — F411 Generalized anxiety disorder: Secondary | ICD-10-CM | POA: Diagnosis not present

## 2022-02-07 DIAGNOSIS — F411 Generalized anxiety disorder: Secondary | ICD-10-CM | POA: Diagnosis not present

## 2022-02-13 DIAGNOSIS — F411 Generalized anxiety disorder: Secondary | ICD-10-CM | POA: Diagnosis not present

## 2022-02-20 DIAGNOSIS — F411 Generalized anxiety disorder: Secondary | ICD-10-CM | POA: Diagnosis not present

## 2022-02-27 DIAGNOSIS — F411 Generalized anxiety disorder: Secondary | ICD-10-CM | POA: Diagnosis not present

## 2022-03-02 DIAGNOSIS — F411 Generalized anxiety disorder: Secondary | ICD-10-CM | POA: Diagnosis not present

## 2022-03-06 DIAGNOSIS — F411 Generalized anxiety disorder: Secondary | ICD-10-CM | POA: Diagnosis not present

## 2022-03-09 DIAGNOSIS — F411 Generalized anxiety disorder: Secondary | ICD-10-CM | POA: Diagnosis not present

## 2022-03-13 DIAGNOSIS — F411 Generalized anxiety disorder: Secondary | ICD-10-CM | POA: Diagnosis not present

## 2022-03-20 DIAGNOSIS — F411 Generalized anxiety disorder: Secondary | ICD-10-CM | POA: Diagnosis not present

## 2022-03-28 DIAGNOSIS — F411 Generalized anxiety disorder: Secondary | ICD-10-CM | POA: Diagnosis not present

## 2022-04-03 DIAGNOSIS — F411 Generalized anxiety disorder: Secondary | ICD-10-CM | POA: Diagnosis not present

## 2022-04-04 DIAGNOSIS — J029 Acute pharyngitis, unspecified: Secondary | ICD-10-CM | POA: Diagnosis not present

## 2022-04-09 DIAGNOSIS — J029 Acute pharyngitis, unspecified: Secondary | ICD-10-CM | POA: Diagnosis not present

## 2022-04-10 DIAGNOSIS — F411 Generalized anxiety disorder: Secondary | ICD-10-CM | POA: Diagnosis not present

## 2022-04-11 DIAGNOSIS — B349 Viral infection, unspecified: Secondary | ICD-10-CM | POA: Diagnosis not present

## 2022-04-17 DIAGNOSIS — F411 Generalized anxiety disorder: Secondary | ICD-10-CM | POA: Diagnosis not present

## 2022-04-18 DIAGNOSIS — F411 Generalized anxiety disorder: Secondary | ICD-10-CM | POA: Diagnosis not present

## 2022-04-24 DIAGNOSIS — F411 Generalized anxiety disorder: Secondary | ICD-10-CM | POA: Diagnosis not present

## 2022-05-01 DIAGNOSIS — F411 Generalized anxiety disorder: Secondary | ICD-10-CM | POA: Diagnosis not present

## 2022-05-10 DIAGNOSIS — F411 Generalized anxiety disorder: Secondary | ICD-10-CM | POA: Diagnosis not present

## 2022-05-17 DIAGNOSIS — F411 Generalized anxiety disorder: Secondary | ICD-10-CM | POA: Diagnosis not present

## 2022-05-31 DIAGNOSIS — F411 Generalized anxiety disorder: Secondary | ICD-10-CM | POA: Diagnosis not present

## 2022-06-05 DIAGNOSIS — F411 Generalized anxiety disorder: Secondary | ICD-10-CM | POA: Diagnosis not present

## 2022-06-20 DIAGNOSIS — F411 Generalized anxiety disorder: Secondary | ICD-10-CM | POA: Diagnosis not present

## 2022-06-26 DIAGNOSIS — F411 Generalized anxiety disorder: Secondary | ICD-10-CM | POA: Diagnosis not present

## 2022-07-05 DIAGNOSIS — F411 Generalized anxiety disorder: Secondary | ICD-10-CM | POA: Diagnosis not present

## 2022-07-13 DIAGNOSIS — F411 Generalized anxiety disorder: Secondary | ICD-10-CM | POA: Diagnosis not present

## 2022-07-23 DIAGNOSIS — F411 Generalized anxiety disorder: Secondary | ICD-10-CM | POA: Diagnosis not present

## 2022-07-25 DIAGNOSIS — F411 Generalized anxiety disorder: Secondary | ICD-10-CM | POA: Diagnosis not present

## 2022-08-02 DIAGNOSIS — F411 Generalized anxiety disorder: Secondary | ICD-10-CM | POA: Diagnosis not present

## 2022-08-06 DIAGNOSIS — Z23 Encounter for immunization: Secondary | ICD-10-CM | POA: Diagnosis not present

## 2022-08-06 DIAGNOSIS — L700 Acne vulgaris: Secondary | ICD-10-CM | POA: Diagnosis not present

## 2022-08-06 DIAGNOSIS — R000 Tachycardia, unspecified: Secondary | ICD-10-CM | POA: Diagnosis not present

## 2022-08-06 DIAGNOSIS — F902 Attention-deficit hyperactivity disorder, combined type: Secondary | ICD-10-CM | POA: Diagnosis not present

## 2022-08-06 DIAGNOSIS — Z0000 Encounter for general adult medical examination without abnormal findings: Secondary | ICD-10-CM | POA: Diagnosis not present

## 2022-08-06 DIAGNOSIS — Z79899 Other long term (current) drug therapy: Secondary | ICD-10-CM | POA: Diagnosis not present

## 2022-08-08 DIAGNOSIS — F411 Generalized anxiety disorder: Secondary | ICD-10-CM | POA: Diagnosis not present

## 2022-08-10 DIAGNOSIS — F411 Generalized anxiety disorder: Secondary | ICD-10-CM | POA: Diagnosis not present

## 2022-08-15 DIAGNOSIS — N920 Excessive and frequent menstruation with regular cycle: Secondary | ICD-10-CM | POA: Diagnosis not present

## 2022-08-15 DIAGNOSIS — Z30431 Encounter for routine checking of intrauterine contraceptive device: Secondary | ICD-10-CM | POA: Diagnosis not present

## 2022-08-16 DIAGNOSIS — F411 Generalized anxiety disorder: Secondary | ICD-10-CM | POA: Diagnosis not present

## 2022-08-21 DIAGNOSIS — N920 Excessive and frequent menstruation with regular cycle: Secondary | ICD-10-CM | POA: Diagnosis not present

## 2022-08-22 DIAGNOSIS — F331 Major depressive disorder, recurrent, moderate: Secondary | ICD-10-CM | POA: Diagnosis not present

## 2022-08-29 DIAGNOSIS — F411 Generalized anxiety disorder: Secondary | ICD-10-CM | POA: Diagnosis not present

## 2022-09-14 DIAGNOSIS — F411 Generalized anxiety disorder: Secondary | ICD-10-CM | POA: Diagnosis not present

## 2022-09-26 DIAGNOSIS — H9201 Otalgia, right ear: Secondary | ICD-10-CM | POA: Diagnosis not present

## 2022-09-26 DIAGNOSIS — H6123 Impacted cerumen, bilateral: Secondary | ICD-10-CM | POA: Diagnosis not present

## 2022-10-11 DIAGNOSIS — F411 Generalized anxiety disorder: Secondary | ICD-10-CM | POA: Diagnosis not present

## 2022-10-19 DIAGNOSIS — F411 Generalized anxiety disorder: Secondary | ICD-10-CM | POA: Diagnosis not present

## 2022-10-26 DIAGNOSIS — F411 Generalized anxiety disorder: Secondary | ICD-10-CM | POA: Diagnosis not present

## 2022-11-02 DIAGNOSIS — F411 Generalized anxiety disorder: Secondary | ICD-10-CM | POA: Diagnosis not present

## 2022-11-15 DIAGNOSIS — Z23 Encounter for immunization: Secondary | ICD-10-CM | POA: Diagnosis not present

## 2022-11-15 DIAGNOSIS — Z113 Encounter for screening for infections with a predominantly sexual mode of transmission: Secondary | ICD-10-CM | POA: Diagnosis not present

## 2022-11-16 DIAGNOSIS — F411 Generalized anxiety disorder: Secondary | ICD-10-CM | POA: Diagnosis not present

## 2022-11-23 DIAGNOSIS — F411 Generalized anxiety disorder: Secondary | ICD-10-CM | POA: Diagnosis not present

## 2022-11-27 DIAGNOSIS — F411 Generalized anxiety disorder: Secondary | ICD-10-CM | POA: Diagnosis not present

## 2022-12-07 DIAGNOSIS — F411 Generalized anxiety disorder: Secondary | ICD-10-CM | POA: Diagnosis not present

## 2022-12-13 DIAGNOSIS — F331 Major depressive disorder, recurrent, moderate: Secondary | ICD-10-CM | POA: Diagnosis not present

## 2022-12-14 DIAGNOSIS — F411 Generalized anxiety disorder: Secondary | ICD-10-CM | POA: Diagnosis not present

## 2022-12-21 DIAGNOSIS — F411 Generalized anxiety disorder: Secondary | ICD-10-CM | POA: Diagnosis not present

## 2023-01-04 DIAGNOSIS — F411 Generalized anxiety disorder: Secondary | ICD-10-CM | POA: Diagnosis not present

## 2023-01-04 DIAGNOSIS — F331 Major depressive disorder, recurrent, moderate: Secondary | ICD-10-CM | POA: Diagnosis not present

## 2023-01-07 DIAGNOSIS — F411 Generalized anxiety disorder: Secondary | ICD-10-CM | POA: Diagnosis not present

## 2023-01-10 DIAGNOSIS — F411 Generalized anxiety disorder: Secondary | ICD-10-CM | POA: Diagnosis not present

## 2023-01-15 DIAGNOSIS — F411 Generalized anxiety disorder: Secondary | ICD-10-CM | POA: Diagnosis not present

## 2023-01-17 DIAGNOSIS — F411 Generalized anxiety disorder: Secondary | ICD-10-CM | POA: Diagnosis not present

## 2023-01-24 DIAGNOSIS — F411 Generalized anxiety disorder: Secondary | ICD-10-CM | POA: Diagnosis not present

## 2023-02-01 DIAGNOSIS — F411 Generalized anxiety disorder: Secondary | ICD-10-CM | POA: Diagnosis not present

## 2023-02-05 DIAGNOSIS — F411 Generalized anxiety disorder: Secondary | ICD-10-CM | POA: Diagnosis not present

## 2023-02-08 DIAGNOSIS — F411 Generalized anxiety disorder: Secondary | ICD-10-CM | POA: Diagnosis not present

## 2023-02-12 DIAGNOSIS — F902 Attention-deficit hyperactivity disorder, combined type: Secondary | ICD-10-CM | POA: Diagnosis not present

## 2023-02-12 DIAGNOSIS — F411 Generalized anxiety disorder: Secondary | ICD-10-CM | POA: Diagnosis not present

## 2023-02-15 DIAGNOSIS — F411 Generalized anxiety disorder: Secondary | ICD-10-CM | POA: Diagnosis not present

## 2023-03-01 DIAGNOSIS — F411 Generalized anxiety disorder: Secondary | ICD-10-CM | POA: Diagnosis not present

## 2023-03-11 DIAGNOSIS — F411 Generalized anxiety disorder: Secondary | ICD-10-CM | POA: Diagnosis not present

## 2023-03-19 DIAGNOSIS — F411 Generalized anxiety disorder: Secondary | ICD-10-CM | POA: Diagnosis not present

## 2023-03-22 DIAGNOSIS — F411 Generalized anxiety disorder: Secondary | ICD-10-CM | POA: Diagnosis not present

## 2023-03-29 DIAGNOSIS — F411 Generalized anxiety disorder: Secondary | ICD-10-CM | POA: Diagnosis not present

## 2023-04-05 DIAGNOSIS — F411 Generalized anxiety disorder: Secondary | ICD-10-CM | POA: Diagnosis not present

## 2023-04-12 DIAGNOSIS — F411 Generalized anxiety disorder: Secondary | ICD-10-CM | POA: Diagnosis not present

## 2023-04-19 DIAGNOSIS — F411 Generalized anxiety disorder: Secondary | ICD-10-CM | POA: Diagnosis not present

## 2023-05-07 DIAGNOSIS — N390 Urinary tract infection, site not specified: Secondary | ICD-10-CM | POA: Diagnosis not present

## 2023-05-07 DIAGNOSIS — N83202 Unspecified ovarian cyst, left side: Secondary | ICD-10-CM | POA: Diagnosis not present

## 2023-05-07 DIAGNOSIS — R1031 Right lower quadrant pain: Secondary | ICD-10-CM | POA: Diagnosis not present

## 2023-05-07 DIAGNOSIS — R109 Unspecified abdominal pain: Secondary | ICD-10-CM | POA: Diagnosis not present

## 2023-06-04 DIAGNOSIS — F4310 Post-traumatic stress disorder, unspecified: Secondary | ICD-10-CM | POA: Diagnosis not present

## 2023-06-10 DIAGNOSIS — F4310 Post-traumatic stress disorder, unspecified: Secondary | ICD-10-CM | POA: Diagnosis not present

## 2023-06-12 DIAGNOSIS — F4310 Post-traumatic stress disorder, unspecified: Secondary | ICD-10-CM | POA: Diagnosis not present

## 2023-06-18 DIAGNOSIS — F4310 Post-traumatic stress disorder, unspecified: Secondary | ICD-10-CM | POA: Diagnosis not present

## 2023-06-26 DIAGNOSIS — F4310 Post-traumatic stress disorder, unspecified: Secondary | ICD-10-CM | POA: Diagnosis not present

## 2023-07-03 DIAGNOSIS — F331 Major depressive disorder, recurrent, moderate: Secondary | ICD-10-CM | POA: Diagnosis not present

## 2023-07-09 DIAGNOSIS — F4310 Post-traumatic stress disorder, unspecified: Secondary | ICD-10-CM | POA: Diagnosis not present

## 2023-07-22 DIAGNOSIS — F4310 Post-traumatic stress disorder, unspecified: Secondary | ICD-10-CM | POA: Diagnosis not present

## 2023-12-21 IMAGING — MR MR ABDOMEN WO/W CM MRCP
13 of 21 series · 22 of 48 positions shown · IV contrast (G GAD)
Comparison: Right upper quadrant ultrasound and CT abdomen pelvis
dated May 25, 2021

CLINICAL DATA: One week history of right upper quadrant pain and
intermittent nausea and vomiting. Acute cholecystitis and
obstructive jaundice.

EXAM:
MRI ABDOMEN WITHOUT AND WITH CONTRAST (INCLUDING MRCP)
TECHNIQUE: Multiplanar multisequence MR imaging of the abdomen was performed
both before and after the administration of intravenous contrast.
Heavily T2-weighted images of the biliary and pancreatic ducts were
obtained, and three-dimensional MRCP images were rendered by post
processing.
CONTRAST:  4.5mL GADAVIST GADOBUTROL 1 MMOL/ML IV SOLN

[Series 2: cor ssfse nav · coronal · 6.0mm · 0.78mm/px · 1 of 27 slices shown]
[im 1/27]
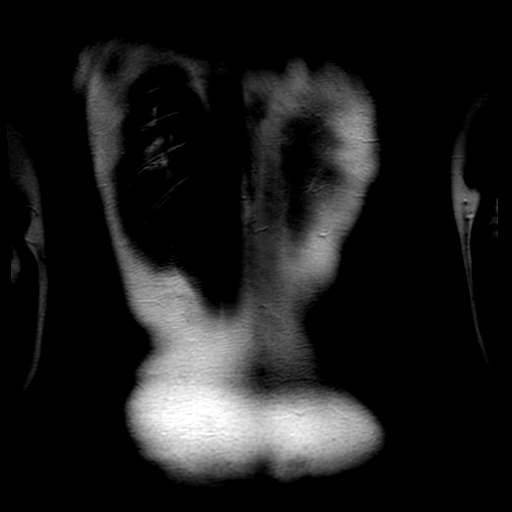

[Series 3: ax ssfse nav · axial · 6.0mm · 0.74mm/px · 1 of 36 slices shown]
[im 1/36]
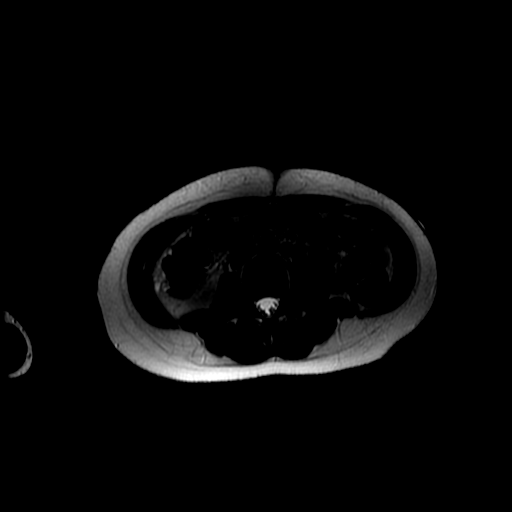

[Series 4: T2 fat-sat · axial · 6.0mm · 0.74mm/px · 1 of 36 slices shown]
[im 1/36]
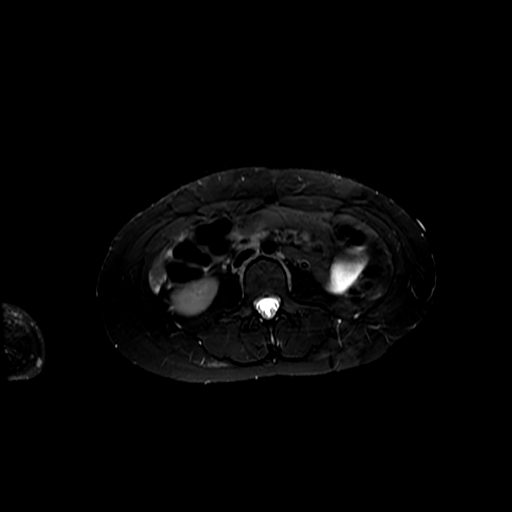

[Series 5: DWI b500 · axial · 8.0mm · 1.48mm/px · 1 of 64 slices shown]
[im 1/64]
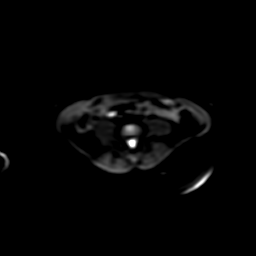

[Series 7: radial 2d thick · coronal · 40.0mm · 0.86mm/px · 1 of 6 slices shown]
[im 1/6]
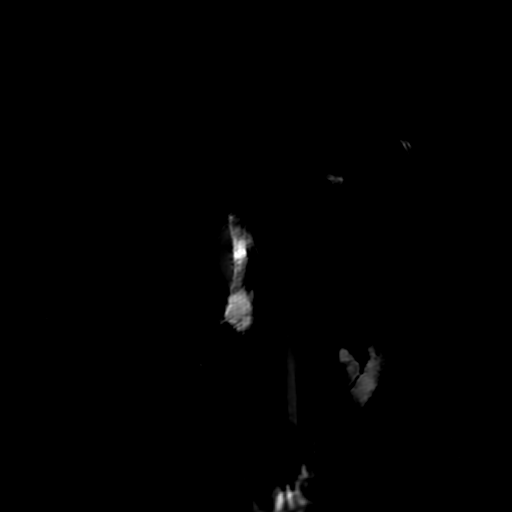

[Series 8: T1 dynamic · axial · 5.0mm · 0.70mm/px · z∈[-137,+140]mm · 2 of 112 slices shown (1 of 4)]
[im 1/112]
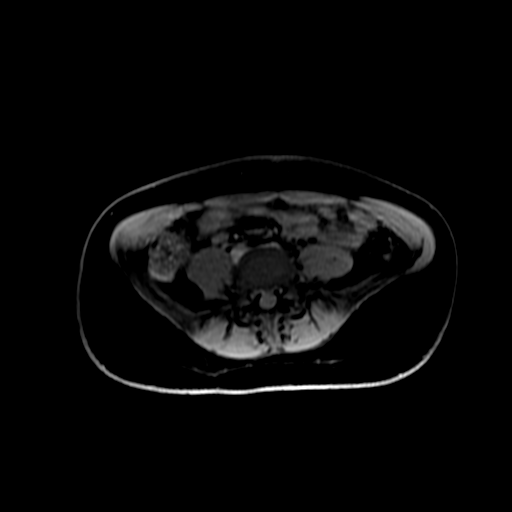
[im 112/112]
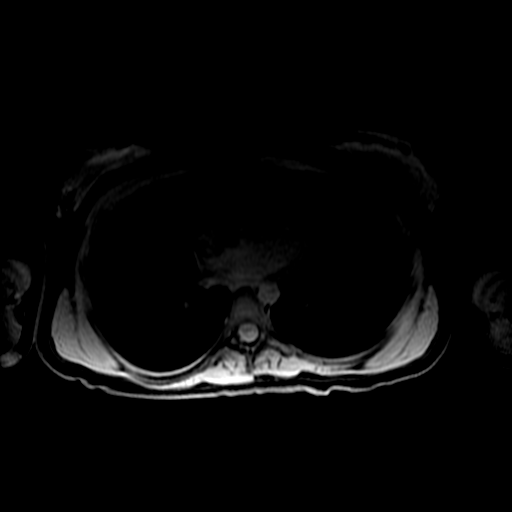

[Series 9: bSSFP · coronal · 6.0mm · 0.70mm/px · 1 of 30 slices shown]
[im 1/30]
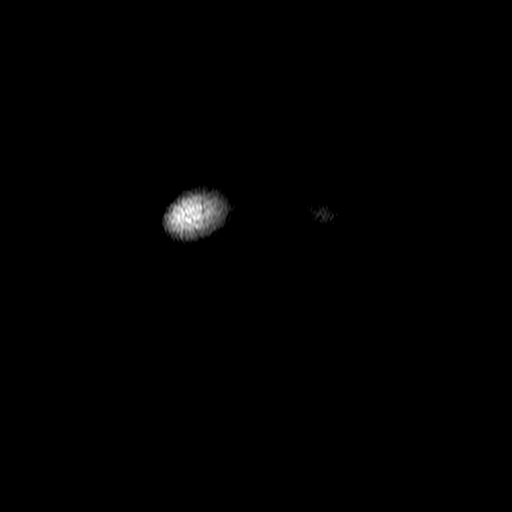

[Series 11: T1 dynamic · coronal · 3.4mm · 1.56mm/px · 3 of 108 slices shown (2 of 4)]
[im 1/108]
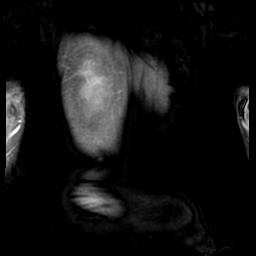
[im 54/108]
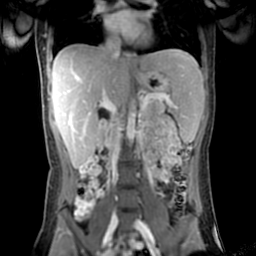
[im 108/108]
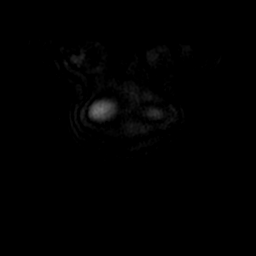

[Series 550: ADC · axial · 8.0mm · 1.48mm/px · 1 of 32 slices shown]
[im 1/32]
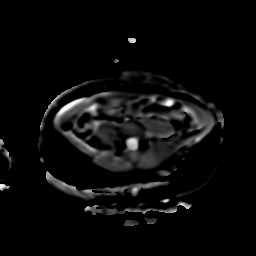

[Series 801: T1 dynamic · axial · 5.0mm · 0.70mm/px · z∈[-137,+140]mm · 3 of 112 slices shown (3 of 4)]
[im 1/112]
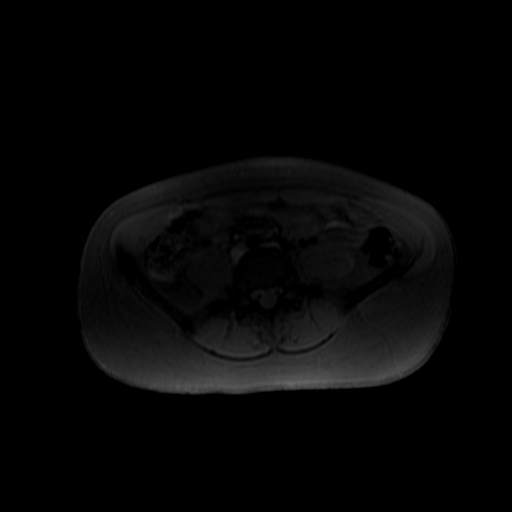
[im 56/112]
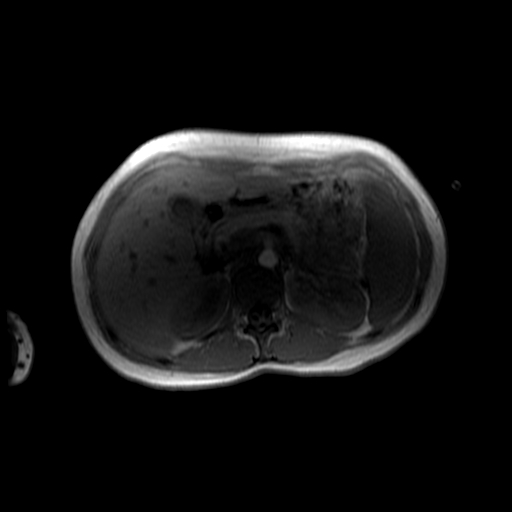
[im 112/112]
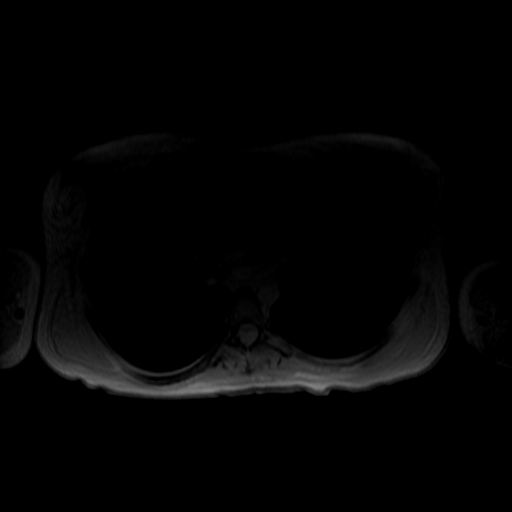

[Series 802: T1 dynamic · axial · 5.0mm · 0.70mm/px · z∈[-137,+140]mm · 3 of 112 slices shown (4 of 4)]
[im 1/112]
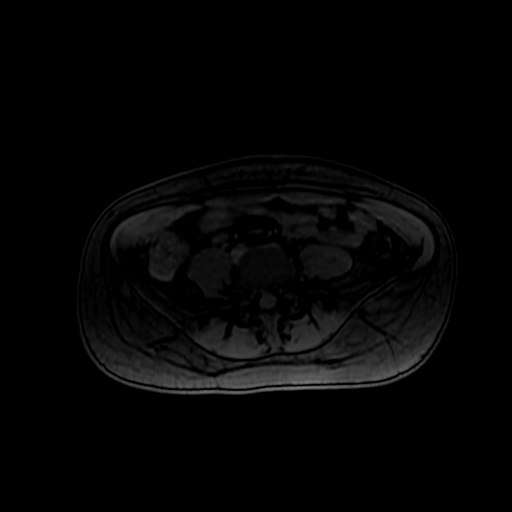
[im 56/112]
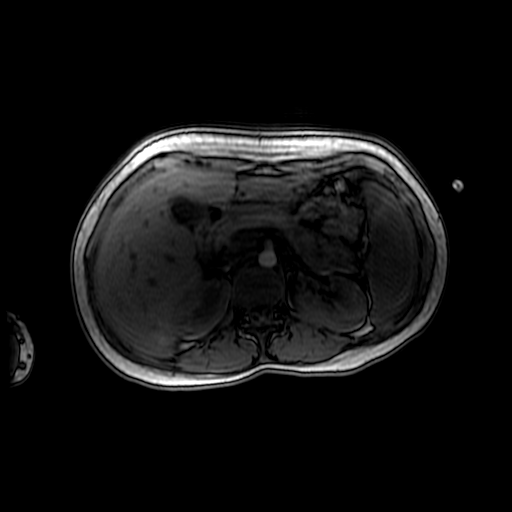
[im 112/112]
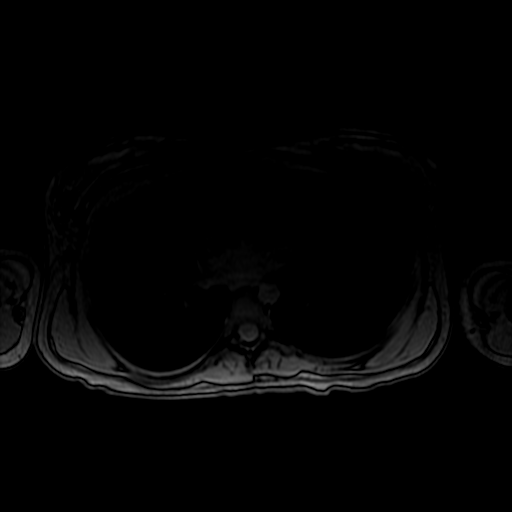

[Series 1000: T1 dynamic post-contrast · axial · non-contrast · 4.0mm · 0.86mm/px · z∈[-108,+122]mm · 3 of 116 slices shown (1 of 2)]
[im 1/116]
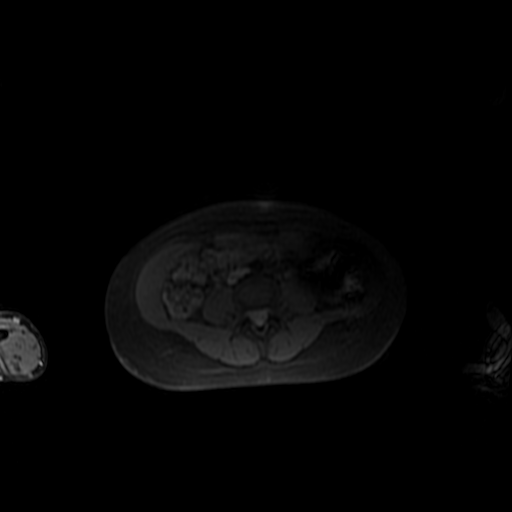
[im 58/116]
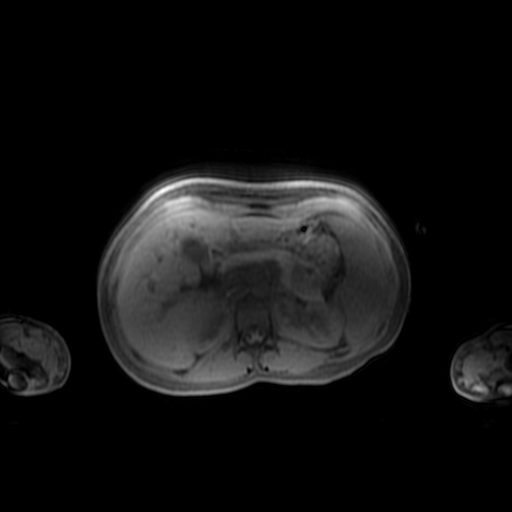
[im 116/116]
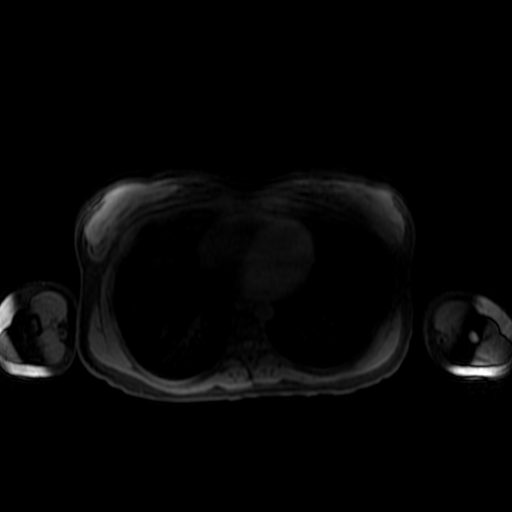

[Series 1001: T1 dynamic post-contrast · axial · non-contrast · 4.0mm · 0.86mm/px · 1 of 116 slices shown (2 of 2)]
[im 1/116]
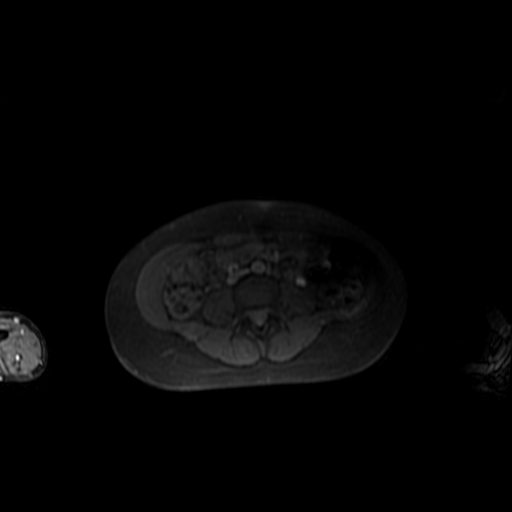

[22 of 48 positions shown; findings below may reference images not displayed]

FINDINGS: Lower chest: Heterogeneous signal in the lung bases likely reflects
atelectasis.

Hepatobiliary: No significant hepatic steatosis. No suspicious
hepatic lesion. Hepatomegaly measuring 19.9 cm in maximum
craniocaudal dimension at the midclavicular line. Periportal edema.

Distended gallbladder with some pericholecystic fluid. No biliary
ductal dilation or overt peribiliary enhancement.

Pancreas: Intrinsic T1 signal of the pancreatic parenchyma is within
normal limits. No pancreatic ductal dilation. Homogeneous
postcontrast enhancement of the pancreas.

Spleen:  Splenomegaly measuring 14.6 cm in craniocaudal dimension.

Adrenals/Urinary Tract: Bilateral adrenal glands appear normal. No
hydronephrosis. No suspicious renal mass.

Stomach/Bowel: Visualized portions within the abdomen are
unremarkable.

Vascular/Lymphatic: No pathologically enlarged lymph nodes
identified. No abdominal aortic aneurysm demonstrated.

Other:  Trace perihepatic free fluid.

Musculoskeletal: No suspicious bone lesions identified.
IMPRESSION: 1. Hepatomegaly with periportal edema and trace perihepatic fluid,
possibly reflecting hepatitis.
2. Distended gallbladder with some pericholecystic fluid, favored to
reflect sequela of a hepatocellular process however acute
cholecystitis is not excluded. Consider more definitive assessment
for acute cholecystitis with nuclear medicine HIDA scan.
3. No biliary ductal dilation or choledocholithiasis.
4. Splenomegaly.
